# Patient Record
Sex: Female | Born: 1960 | Race: White | Hispanic: No | Marital: Married | Smoking: Never smoker
Health system: Southern US, Community
[De-identification: ages and names within clinical notes are randomized; demographics above are authoritative.]

## PROBLEM LIST (undated history)

## (undated) DIAGNOSIS — Q231 Congenital insufficiency of aortic valve: Secondary | ICD-10-CM

## (undated) DIAGNOSIS — I442 Atrioventricular block, complete: Secondary | ICD-10-CM

## (undated) DIAGNOSIS — I350 Nonrheumatic aortic (valve) stenosis: Secondary | ICD-10-CM

## (undated) DIAGNOSIS — Z7901 Long term (current) use of anticoagulants: Secondary | ICD-10-CM

## (undated) DIAGNOSIS — Q2381 Bicuspid aortic valve: Secondary | ICD-10-CM

## (undated) HISTORY — DX: Atrioventricular block, complete: I44.2

## (undated) HISTORY — DX: Nonrheumatic aortic (valve) stenosis: I35.0

## (undated) HISTORY — DX: Congenital insufficiency of aortic valve: Q23.1

## (undated) HISTORY — PX: OTHER SURGICAL HISTORY: SHX169

## (undated) HISTORY — DX: Long term (current) use of anticoagulants: Z79.01

## (undated) HISTORY — PX: BREAST SURGERY: SHX581

## (undated) HISTORY — DX: Bicuspid aortic valve: Q23.81

---

## 1998-09-23 ENCOUNTER — Other Ambulatory Visit: Admission: RE | Admit: 1998-09-23 | Discharge: 1998-09-23 | Payer: Self-pay | Admitting: Obstetrics & Gynecology

## 1999-11-03 ENCOUNTER — Other Ambulatory Visit: Admission: RE | Admit: 1999-11-03 | Discharge: 1999-11-03 | Payer: Self-pay | Admitting: Obstetrics & Gynecology

## 2000-11-06 ENCOUNTER — Other Ambulatory Visit: Admission: RE | Admit: 2000-11-06 | Discharge: 2000-11-06 | Payer: Self-pay | Admitting: Obstetrics & Gynecology

## 2001-12-20 ENCOUNTER — Ambulatory Visit (HOSPITAL_COMMUNITY): Admission: RE | Admit: 2001-12-20 | Discharge: 2001-12-20 | Payer: Self-pay | Admitting: Obstetrics & Gynecology

## 2002-02-11 ENCOUNTER — Other Ambulatory Visit: Admission: RE | Admit: 2002-02-11 | Discharge: 2002-02-11 | Payer: Self-pay | Admitting: Obstetrics & Gynecology

## 2003-08-20 ENCOUNTER — Other Ambulatory Visit: Admission: RE | Admit: 2003-08-20 | Discharge: 2003-08-20 | Payer: Self-pay | Admitting: Obstetrics & Gynecology

## 2004-10-04 ENCOUNTER — Other Ambulatory Visit: Admission: RE | Admit: 2004-10-04 | Discharge: 2004-10-04 | Payer: Self-pay | Admitting: Obstetrics & Gynecology

## 2005-11-22 ENCOUNTER — Other Ambulatory Visit: Admission: RE | Admit: 2005-11-22 | Discharge: 2005-11-22 | Payer: Self-pay | Admitting: Obstetrics & Gynecology

## 2011-08-15 ENCOUNTER — Other Ambulatory Visit: Payer: Self-pay | Admitting: Cardiology

## 2011-08-15 ENCOUNTER — Ambulatory Visit
Admission: RE | Admit: 2011-08-15 | Discharge: 2011-08-15 | Disposition: A | Discharge status: Home / Self Care | Payer: PRIVATE HEALTH INSURANCE | Source: Ambulatory Visit | Attending: Cardiology | Admitting: Cardiology

## 2011-08-15 DIAGNOSIS — I359 Nonrheumatic aortic valve disorder, unspecified: Secondary | ICD-10-CM

## 2011-08-17 ENCOUNTER — Ambulatory Visit (HOSPITAL_COMMUNITY)
Admission: RE | Admit: 2011-08-17 | Discharge: 2011-08-17 | Disposition: A | Discharge status: Home / Self Care | Payer: PRIVATE HEALTH INSURANCE | Source: Ambulatory Visit | Attending: Cardiology | Admitting: Cardiology

## 2011-08-17 ENCOUNTER — Encounter (HOSPITAL_COMMUNITY): Payer: Self-pay | Admitting: Cardiology

## 2011-08-17 DIAGNOSIS — I350 Nonrheumatic aortic (valve) stenosis: Secondary | ICD-10-CM | POA: Insufficient documentation

## 2011-08-17 DIAGNOSIS — I359 Nonrheumatic aortic valve disorder, unspecified: Secondary | ICD-10-CM | POA: Insufficient documentation

## 2011-08-17 HISTORY — PX: CARDIAC CATHETERIZATION: SHX172

## 2011-08-17 LAB — POCT I-STAT 3, VENOUS BLOOD GAS (G3P V)
Bicarbonate: 20.8 mEq/L (ref 20.0–24.0)
TCO2: 22 mmol/L (ref 0–100)
pCO2, Ven: 37.1 mmHg — ABNORMAL LOW (ref 45.0–50.0)
pH, Ven: 7.358 — ABNORMAL HIGH (ref 7.250–7.300)

## 2011-08-17 LAB — POCT I-STAT 3, ART BLOOD GAS (G3+)
O2 Saturation: 97 %
TCO2: 22 mmol/L (ref 0–100)
pCO2 arterial: 33.4 mmHg — ABNORMAL LOW (ref 35.0–45.0)
pH, Arterial: 7.408 — ABNORMAL HIGH (ref 7.350–7.400)
pO2, Arterial: 88 mmHg (ref 80.0–100.0)

## 2011-08-17 NOTE — Cardiovascular Report (Signed)
  NAMESUZZANE, Zoe Matthews               ACCOUNT NO.:  [id number]  MEDICAL RECORD NO.:  [id number]  LOCATION:  [facility]                         FACILITY:  [facility]  PHYSICIAN:  Georga Hacking, M.D.DATE OF BIRTH:  1961-02-28  DATE OF PROCEDURE: 08/17/2011                            CARDIAC CATHETERIZATION   INDICATIONS:  Symptomatic aortic stenosis.  PROCEDURE:  Right and left heart catheterizations with coronary angiograms, left ventriculogram, measurement of gradient across the aortic valve and a distal aortogram.  COMMENTS ABOUT PROCEDURE:  The patient was brought to the catheterization laboratory and was prepped and draped in the usual manner.  After Xylocaine anesthesia, a 7-French sheath was placed in the right femoral vein.  A right heart catheterization was done, cardiac outputs were measured.  The right femoral artery was then entered with a single anterior needle wall stick and a 5-French sheath was placed.  The aortic valve was crossed using a straight wire after the patient was given 1000 units of heparin.  It crossed on the first try with a peak-to- peak gradient of close to 100.  A 30 mL ventriculogram was performed. Coronary angiograms were made using 5-French catheters.  A distal aortic injection was then made using 30 mL of contrast and she was taken to the holding area for sheath removal and tolerated the procedure well.  HEMODYNAMIC DATA:  Right atrium:  A equals 7, V equals 4, mean equals 3. Right ventricle 30/124.  Pulmonary artery 30/11, mean equals 20, wedge mean of 12.  Aortic pressure 113/69.  LV pressure 205/113.  Peak-to-peak gradient is 92 mmHg.  Mean gradient 57 mmHg.  Aortic valve area 0.51 cm2.  Aortic saturation 97%.  Pulmonary artery saturation 69%.  ANGIOGRAPHIC DATA:  Left ventriculogram:  Performed in the 30-degree RAO projection.  There are increased trabeculations consistent with ventricular hypertrophy.  The left ventricle is normal in size.   The aortic valve is heavily calcified with reduced opening.  There was no mitral regurgitation.  The left ventricular wall motion is normal and estimated ejection fraction is 60%.  Coronary arteries arise and distribute normally.  There is no coronary calcification noted.  Left main coronary artery is normal.  Left anterior descending appears to have a somewhat twin LAD or a large diagonal branch and is free of disease.  The circumflex is normal.  The right coronary artery is a large dominant vessel and appears normal.  The distal aortogram shows normal bilateral renal arteries and a normal distal aorta and normal run off.  IMPRESSION: 1. Critical aortic stenosis. 2. Very minimal elevation of pulmonary artery systolic pressure. 3. Normal coronary arteries. 4. Normal distal aorta.  RECOMMENDATIONS:  Aortic valve replacement.  Consultation with Dr. Tressie Stalker.     Georga Hacking, M.D.     WST/MEDQ  D:  08/17/2011  T:  08/17/2011  Job:  [id number]  cc:   Freddy Finner, M.D. Salvatore Decent. Cornelius Moras, M.D.  Electronically Signed by Lacretia Nicks. Donnie Aho M.D. on 08/17/2011 12:03:23 PM

## 2011-08-18 ENCOUNTER — Institutional Professional Consult (permissible substitution) (INDEPENDENT_AMBULATORY_CARE_PROVIDER_SITE_OTHER): Payer: PRIVATE HEALTH INSURANCE | Admitting: Thoracic Surgery (Cardiothoracic Vascular Surgery)

## 2011-08-18 ENCOUNTER — Encounter: Payer: Self-pay | Admitting: Thoracic Surgery (Cardiothoracic Vascular Surgery)

## 2011-08-18 VITALS — BP 126/82 | HR 90 | Resp 20 | Ht 61.0 in | Wt 146.0 lb

## 2011-08-18 DIAGNOSIS — I350 Nonrheumatic aortic (valve) stenosis: Secondary | ICD-10-CM

## 2011-08-18 DIAGNOSIS — I359 Nonrheumatic aortic valve disorder, unspecified: Secondary | ICD-10-CM

## 2011-08-18 NOTE — Progress Notes (Signed)
PCP is Freddy Finner, MD Referring Provider is Darden Palmer., *  Chief Complaint  Patient presents with  . Aortic Stenosis    Referral from [name] for surgical eval, Aortic stensos, Cath'ed 08/17/11    HPI: Patient is a 50 year old married white female from [place] with known history of bicuspid aortic valve and aortic stenosis. The patient was noted to have a heart murmur at birth and has been followed carefully ever since. For the past several years the patient has had routine followup echocardiograms with Dr. Donnie Aho. Over the last 3 months the patient has developed exertional shortness of breath. Followup echocardiogram performed 08/09/2011 revealed progression of severity of aortic stenosis the point where the patient now has quite severe aortic stenosis. The peak velocity across the aortic valve measured 5.5 m/s. Peak and mean transvalvular gradients were estimated to be 120 and 78 mm mercury respectively. The patient has concentric left ventricular hypertrophy with normal left ventricular systolic function. There was trace mitral regurgitation and mild tricuspid regurgitation. The patient subsequently underwent left and right heart catheterization on 08/17/2011. This confirmed the presence of severe aortic stenosis with a mean gradient across the valve measured 57 mm mercury and peak to peak gradient 92 mm mercury. Aortic valve area was estimated 0.51 cm. Pulmonary artery pressures measured 30/11 with a pulmonary A wedge pressure of 12. The patient was noted to have normal coronary artery anatomy with no significant coronary artery disease. 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 The descending thoracic and abdominal aorta and iliac vessels was normal and free of any significant aortoiliac disease. The patient has been referred for elective aortic valve replacement.  Past Medical History  Diagnosis Date  . AS (aortic stenosis)   . Bicuspid aortic valve      Past Surgical History  Procedure Date  . Cardiac catheterization 08/17/11    normal coronaries, critical AS  . Cyst left breast removed     Family History  Problem Relation Age of Onset  . Diabetes Father   . Hyperlipidemia Father   . Hypertension Father     Social History History  Substance Use Topics  . Smoking status: Never Smoker   . Smokeless tobacco: Never Used  . Alcohol Use: No    Current Outpatient Prescriptions  Medication Sig Dispense Refill  . B Complex Vitamins (VITAMIN-B COMPLEX) TABS Take 1 tablet by mouth 1 day or 1 dose.        . calcium carbonate (OS-CAL) 600 MG TABS Take 600 mg by mouth 2 (two) times daily with a meal.        . Cholecalciferol (VITAMIN D3) 2000 UNITS capsule Take 2,000 Units by mouth daily.        . Magnesium 250 MG TABS Take 1 tablet by mouth 1 day or 1 dose.        . Multiple Vitamin (MULTIVITAMIN) tablet Take 1 tablet by mouth daily.          Allergies  Allergen Reactions  . Codeine Nausea And Vomiting  . Sulfa Antibiotics Hives    Review of Systems: The patient reports normal appetite. She is 5 foot 1 inches tall and weighs approximately 146 pounds. She has been actively trying to lose weight with a combination of diet and exercise, and she is been successful in having lost 25 pounds over last 6 months to a year. The patient reports a three-month history of exertional shortness of breath. Shortness of breath occurs  with moderate physical activity. The patient denies resting shortness of breath, PND, orthopnea, or lower extremity edema. The patient has occasional brief palpitations. The patient denies any chest tightness chest pressure either with activity or at rest. Patient denies any productive cough, hemoptysis, wheezing. Patient reports no difficulty swallowing. She denies symptoms of reflux. Bowel function is regular. She has no history of any hematochezia, hematemesis, melena. Genitourinary urinary review of systems is normal  with no history of urinary urgency or frequency. Patient denies arthritis or arthralgias. The patient denies pain in her legs with walking. She denies any dizzy spells or syncopal episodes. She denies any transient visual disturbances. Patient denies any transient numbness or weakness involving either upper or lower extremity. She has good dentition and sees his dentist on a regular basis. She has no recent visual disturbances. The remainder of her review of systems unremarkable.  BP 126/82  Pulse 90  Resp 20  Ht 5\' 1"  (1.549 m)  Wt 146 lb (66.225 kg)  BMI 27.59 kg/m2  SpO2 99%  LMP 08/15/2011 Physical Exam: On physical exam the patient is a well-appearing female who appears his stated age in no acute distress. HEENT exam is unrevealing. The neck is supple. There is no palpable lymphadenopathy. There no carotid bruits. Auscultation of the chest demonstrates clear breath sounds which are symmetrical bilaterally. Cardiovascular exam is notable for regular rate and rhythm. There is a prominent grade 3/6 crescendo decrescendo systolic murmur heard best along the sternal border. No diastolic murmurs are noted. The abdomen is soft slightly obese and nontender. There are no palpable masses. Bowel sounds are present. Extremities are warm and well-perfused. Patient is tender in the right groin from cardiac catheterization. Femoral pulse on the left is palpable but somewhat diminished. There is no lower extremity edema. The skin is clean dry and healthy appearing throughout. Rectal and GU exams are both deferred. Neurologic examination is grossly nonfocal and symmetrical throughout.  Diagnostic Tests: Report of 2-D echocardiogram performed 08/09/2011 is reviewed. This confirmed the presence of bicuspid aortic valve with severe aortic stenosis. Peak velocity across the valve was 5.5 m/s. Peak and mean transvalvular gradients were estimated 120 and 78 mm mercury respectively. Aortic valve area was estimated 0.4  cm. Left ventricular outflow tract diameter was reportedly 1.9 cm. Left ventricular systolic function with ejection fraction estimated at 55%. There is mild to moderate concentric left ventricular hypertrophy. There was mild to moderate left atrial chamber enlargement. There is trace mitral regurgitation. There is mild tricuspid regurgitation. No other abnormalities were noted. In comparison with reports from previous echocardiograms in April 2012 degree of aortic stenosis have increased further.  Left and right heart catheterization performed 08/17/2011 is reviewed. This confirmed the presence of severe aortic stenosis with mean gradient across the valve measured 57 mm mercury. Aortic valve area was estimated 0.51 cm. Pulmonary artery pressures measured 30/11 with pulmonary A wedge pressure 12 and central venous pressure was 3. There is normal coronary artery anatomy with right dominant coronary circulation. There is no significant coronary artery disease. There is mild dilatation of the a setting aorta but no significant aneurysm formation. The descending thoracic aorta and abdominal aorta are normal. Iliac vessels are normal. There is no significant aortoiliac disease.  Impression: Bicuspid native aortic valve with severe aortic stenosis. The patient has developed symptoms of exertional shortness of breath. She is normal left ventricular function and normal coronary artery anatomy with no significant coronary artery disease. I agree that the patient needs elective  aortic valve replacement. By report the patient's left ventricular output tract diameter was only 1.9 cm. The patient is relatively small in stature with total body surface area approximately 1.7-1.8 meters squared.  Plan: I've discussed options at length with the patient and her sister here in the office today. Her husband is out of town for business and will return within 2 weeks. We discussed the relative risks and benefits of elective  aortic valve replacement versus continued followup medical therapy. The rationale for proceeding with surgery at this juncture has been discussed in detail. Surgical alternatives have been discussed in detail. In particular, we spent a great deal of time discussing whether or not to replace her valve using a mechanical prosthesis, a bioprosthetic tissue valve, or the pulmonary autograft (Ross procedure). We discussed the risks of long-term anticoagulation with Coumadin in the setting of mechanical valve, and we discussed the risk of late structural valve deterioration in failure following either bioprosthetic tissue valve or Ross procedure. We also discussed the fact that she may have a relatively small sized aortic root and that this may impact are surgical approach. The possibility of need for aortic root enlargement or aortic root replacement has been discussed. At this point she seems to be inclined to wish for her valve be replaced using a mechanical prosthesis. This seems entirely reasonable under the circumstances and is probably the best choice for her.  The patient has expressed interest in minimally invasive approach for aortic valve replacement. Overall I think she would be an excellent candidate for use of mini thoracotomy approach with the only potential concern being the fact that if her aortic root is in fact quite small the mini thoracotomy approach might affect adversely our ability to get the big as possible mechanical prosthesis safely implanted. I will make an effort to directly reviewed images from her recent echocardiogram. We will also plan to perform transesophageal echocardiogram intraoperatively before we begin her operation. If either in fact suggest that her true aortic annulus is less than 21 mm in diameter, I would favor a more conservative approach using a conventional sternotomy. However, if her aortic root in fact appears to be adequate in size, it would be reasonable to proceed with  the mini thoracotomy approach. The patient understands and accepts all potential associated risks of surgery. We tentatively plan to proceed with surgery on November 15 after her husband has returned to town. We will see her back here in the office on November 12 for followup prior to surgery. All of her questions been addressed.

## 2011-08-21 ENCOUNTER — Other Ambulatory Visit: Payer: Self-pay | Admitting: Thoracic Surgery (Cardiothoracic Vascular Surgery)

## 2011-08-21 DIAGNOSIS — I059 Rheumatic mitral valve disease, unspecified: Secondary | ICD-10-CM

## 2011-08-21 DIAGNOSIS — I359 Nonrheumatic aortic valve disorder, unspecified: Secondary | ICD-10-CM

## 2011-08-25 ENCOUNTER — Other Ambulatory Visit: Payer: Self-pay

## 2011-08-25 DIAGNOSIS — I359 Nonrheumatic aortic valve disorder, unspecified: Secondary | ICD-10-CM

## 2011-08-28 ENCOUNTER — Other Ambulatory Visit: Payer: Self-pay

## 2011-08-28 ENCOUNTER — Encounter (HOSPITAL_COMMUNITY)
Admission: RE | Admit: 2011-08-28 | Discharge: 2011-08-28 | Disposition: A | Discharge status: Home / Self Care | Payer: PRIVATE HEALTH INSURANCE | Source: Ambulatory Visit | Attending: Thoracic Surgery (Cardiothoracic Vascular Surgery) | Admitting: Thoracic Surgery (Cardiothoracic Vascular Surgery)

## 2011-08-28 ENCOUNTER — Ambulatory Visit (INDEPENDENT_AMBULATORY_CARE_PROVIDER_SITE_OTHER): Payer: No Typology Code available for payment source | Admitting: Thoracic Surgery (Cardiothoracic Vascular Surgery)

## 2011-08-28 ENCOUNTER — Encounter (HOSPITAL_COMMUNITY): Payer: Self-pay

## 2011-08-28 ENCOUNTER — Encounter: Payer: Self-pay | Admitting: Thoracic Surgery (Cardiothoracic Vascular Surgery)

## 2011-08-28 ENCOUNTER — Ambulatory Visit (HOSPITAL_COMMUNITY)
Admission: RE | Admit: 2011-08-28 | Discharge: 2011-08-28 | Disposition: A | Discharge status: Home / Self Care | Payer: PRIVATE HEALTH INSURANCE | Source: Ambulatory Visit | Attending: Thoracic Surgery (Cardiothoracic Vascular Surgery) | Admitting: Thoracic Surgery (Cardiothoracic Vascular Surgery)

## 2011-08-28 VITALS — BP 126/77 | HR 80 | Resp 18 | Ht 61.0 in | Wt 145.0 lb

## 2011-08-28 DIAGNOSIS — I359 Nonrheumatic aortic valve disorder, unspecified: Secondary | ICD-10-CM

## 2011-08-28 DIAGNOSIS — R0602 Shortness of breath: Secondary | ICD-10-CM | POA: Insufficient documentation

## 2011-08-28 DIAGNOSIS — I2510 Atherosclerotic heart disease of native coronary artery without angina pectoris: Secondary | ICD-10-CM

## 2011-08-28 DIAGNOSIS — Z01810 Encounter for preprocedural cardiovascular examination: Secondary | ICD-10-CM

## 2011-08-28 DIAGNOSIS — I350 Nonrheumatic aortic (valve) stenosis: Secondary | ICD-10-CM

## 2011-08-28 LAB — CBC
Hemoglobin: 11.9 g/dL — ABNORMAL LOW (ref 12.0–15.0)
RBC: 3.7 MIL/uL — ABNORMAL LOW (ref 3.87–5.11)
WBC: 5.9 10*3/uL (ref 4.0–10.5)

## 2011-08-28 LAB — COMPREHENSIVE METABOLIC PANEL
ALT: 11 U/L (ref 0–35)
Alkaline Phosphatase: 59 U/L (ref 39–117)
CO2: 21 mEq/L (ref 19–32)
Chloride: 104 mEq/L (ref 96–112)
GFR calc Af Amer: >90 mL/min (ref >90)
GFR calc non Af Amer: >90 mL/min (ref >90)
Glucose, Bld: 87 mg/dL (ref 70–99)
Potassium: 3.8 mEq/L (ref 3.5–5.1)
Sodium: 137 mEq/L (ref 135–145)
Total Protein: 6.8 g/dL (ref 6.0–8.3)

## 2011-08-28 LAB — ABO/RH: ABO/RH(D): O POS

## 2011-08-28 LAB — BLOOD GAS, ARTERIAL
Drawn by: 344381
FIO2: 0.21 %
pCO2 arterial: 36.9 mmHg (ref 35.0–45.0)
pO2, Arterial: 139 mmHg — ABNORMAL HIGH (ref 80.0–100.0)

## 2011-08-28 LAB — URINALYSIS, ROUTINE W REFLEX MICROSCOPIC
Ketones, ur: NEGATIVE mg/dL
Leukocytes, UA: NEGATIVE
Nitrite: NEGATIVE
pH: 6 (ref 5.0–8.0)

## 2011-08-28 LAB — PROTIME-INR: INR: 1 (ref 0.00–1.49)

## 2011-08-28 MED ORDER — CHLORHEXIDINE GLUCONATE 4 % EX LIQD: 30.0000 mL | CUTANEOUS | Status: DC

## 2011-08-28 NOTE — H&P (Signed)
Purcell Nails, MD  08/18/2011  2:25 PM  Signed  PCP is Freddy Finner, MD Referring Provider is Darden Palmer., *    Chief Complaint   Patient presents with   .  Aortic Stenosis       Referral from [name] for surgical eval, Aortic stensos, Cath'ed 08/17/11     HPI: Patient is a 50 year old married white female from [place] with known history of bicuspid aortic valve and aortic stenosis. The patient was noted to have a heart murmur at birth and has been followed carefully ever since. For the past several years the patient has had routine followup echocardiograms with Dr. Donnie Aho. Over the last 3 months the patient has developed exertional shortness of breath. Followup echocardiogram performed 08/09/2011 revealed progression of severity of aortic stenosis the point where the patient now has quite severe aortic stenosis. The peak velocity across the aortic valve measured 5.5 m/s. Peak and mean transvalvular gradients were estimated to be 120 and 78 mm mercury respectively. The patient has concentric left ventricular hypertrophy with normal left ventricular systolic function. There was trace mitral regurgitation and mild tricuspid regurgitation. The patient subsequently underwent left and right heart catheterization on 08/17/2011. This confirmed the presence of severe aortic stenosis with a mean gradient across the valve measured 57 mm mercury and peak to peak gradient 92 mm mercury. Aortic valve area was estimated 0.51 cm. Pulmonary artery pressures measured 30/11 with a pulmonary A wedge pressure of 12. The patient was noted to have normal coronary artery anatomy with no significant coronary artery disease. 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 The descending thoracic and abdominal aorta and iliac vessels was normal and free of any significant aortoiliac disease. The patient has been referred for elective aortic valve replacement.    Past Medical History   Diagnosis   Date   .  AS (aortic stenosis)     .  Bicuspid aortic valve         Past Surgical History   Procedure  Date   .  Cardiac catheterization  08/17/11       normal coronaries, critical AS   .  Cyst left breast removed         Family History   Problem  Relation  Age of Onset   .  Diabetes  Father     .  Hyperlipidemia  Father     .  Hypertension  Father       Social History History   Substance Use Topics   .  Smoking status:  Never Smoker    .  Smokeless tobacco:  Never Used   .  Alcohol Use:  No       Current Outpatient Prescriptions   Medication  Sig  Dispense  Refill   .  B Complex Vitamins (VITAMIN-B COMPLEX) TABS  Take 1 tablet by mouth 1 day or 1 dose.           .  calcium carbonate (OS-CAL) 600 MG TABS  Take 600 mg by mouth 2 (two) times daily with a meal.           .  Cholecalciferol (VITAMIN D3) 2000 UNITS capsule  Take 2,000 Units by mouth daily.           .  Magnesium 250 MG TABS  Take 1 tablet by mouth 1 day or 1 dose.           .  Multiple Vitamin (  MULTIVITAMIN) tablet  Take 1 tablet by mouth daily.               Allergies   Allergen  Reactions   .  Codeine  Nausea And Vomiting   .  Sulfa Antibiotics  Hives     Review of Systems: The patient reports normal appetite. She is 5 foot 1 inches tall and weighs approximately 146 pounds. She has been actively trying to lose weight with a combination of diet and exercise, and she is been successful in having lost 25 pounds over last 6 months to a year. The patient reports a three-month history of exertional shortness of breath. Shortness of breath occurs with moderate physical activity. The patient denies resting shortness of breath, PND, orthopnea, or lower extremity edema. The patient has occasional brief palpitations. The patient denies any chest tightness chest pressure either with activity or at rest. Patient denies any productive cough, hemoptysis, wheezing. Patient reports no difficulty swallowing. She denies symptoms  of reflux. Bowel function is regular. She has no history of any hematochezia, hematemesis, melena. Genitourinary urinary review of systems is normal with no history of urinary urgency or frequency. Patient denies arthritis or arthralgias. The patient denies pain in her legs with walking. She denies any dizzy spells or syncopal episodes. She denies any transient visual disturbances. Patient denies any transient numbness or weakness involving either upper or lower extremity. She has good dentition and sees his dentist on a regular basis. She has no recent visual disturbances. The remainder of her review of systems unremarkable.  BP 126/82  Pulse 90  Resp 20  Ht 5\' 1"  (1.549 m)  Wt 146 lb (66.225 kg)  BMI 27.59 kg/m2  SpO2 99%  LMP 08/15/2011 Physical Exam: On physical exam the patient is a well-appearing female who appears his stated age in no acute distress. HEENT exam is unrevealing. The neck is supple. There is no palpable lymphadenopathy. There no carotid bruits. Auscultation of the chest demonstrates clear breath sounds which are symmetrical bilaterally. Cardiovascular exam is notable for regular rate and rhythm. There is a prominent grade 3/6 crescendo decrescendo systolic murmur heard best along the sternal border. No diastolic murmurs are noted. The abdomen is soft slightly obese and nontender. There are no palpable masses. Bowel sounds are present. Extremities are warm and well-perfused. Patient is tender in the right groin from cardiac catheterization. Femoral pulse on the left is palpable but somewhat diminished. There is no lower extremity edema. The skin is clean dry and healthy appearing throughout. Rectal and GU exams are both deferred. Neurologic examination is grossly nonfocal and symmetrical throughout.  Diagnostic Tests: Report of 2-D echocardiogram performed 08/09/2011 is reviewed. This confirmed the presence of bicuspid aortic valve with severe aortic stenosis. Peak velocity across  the valve was 5.5 m/s. Peak and mean transvalvular gradients were estimated 120 and 78 mm mercury respectively. Aortic valve area was estimated 0.4 cm. Left ventricular outflow tract diameter was reportedly 1.9 cm. Left ventricular systolic function with ejection fraction estimated at 55%. There is mild to moderate concentric left ventricular hypertrophy. There was mild to moderate left atrial chamber enlargement. There is trace mitral regurgitation. There is mild tricuspid regurgitation. No other abnormalities were noted. In comparison with reports from previous echocardiograms in April 2012 degree of aortic stenosis have increased further.  Left and right heart catheterization performed 08/17/2011 is reviewed. This confirmed the presence of severe aortic stenosis with mean gradient across the valve measured 57 mm mercury. Aortic valve  area was estimated 0.51 cm. Pulmonary artery pressures measured 30/11 with pulmonary A wedge pressure 12 and central venous pressure was 3. There is normal coronary artery anatomy with right dominant coronary circulation. There is no significant coronary artery disease. There is mild dilatation of the a setting aorta but no significant aneurysm formation. The descending thoracic aorta and abdominal aorta are normal. Iliac vessels are normal. There is no significant aortoiliac disease.  Impression: Bicuspid native aortic valve with severe aortic stenosis. The patient has developed symptoms of exertional shortness of breath. She is normal left ventricular function and normal coronary artery anatomy with no significant coronary artery disease. I agree that the patient needs elective aortic valve replacement.   Plan:  I have reviewed the indications, risks, and potential benefits of surgery with the patient and her husband here in the office today.  We spent in excess of 30 minutes during her consultation today. Alternative treatment strategies were discussed. They understand  and accept all potential risks of surgery including but not limited to risk of death, stroke or other neurologic complication, myocardial infarction, congestive heart failure, respiratory failure, renal failure, bleeding requiring transfusion and/or reexploration, arrhythmia, infection or other wound complications, pneumonia, pleural and/or pericardial effusion, pulmonary embolus, aortic dissection or other major vascular complication, or delayed complications related to replacement.    The patient hopes to have her surgery performed via minimally invasive approach. We plan to proceed with surgery on Thursday and initially carefully evaluate the size of her aortic annulus and aortic root using transesophageal echocardiogram. If it appears that the aortic root size is in fact relatively small we will proceed with a conventional sternotomy. If the aortic root size appears to be adequate, we will proceed with the mini thoracotomy approach.  The patient specifically requests that her valve be replaced using a mechanical prosthesis. She understands and accepts all associated risks related to long-term anticoagulation.  All of their questions have been answered.

## 2011-08-28 NOTE — Pre-Procedure Instructions (Addendum)
[address] [facility]  08/28/2011   Your procedure is scheduled on:  08/31/2011  Report to [facility] at 5:30 AM.  Call this number if you have problems the morning of surgery: [phone]   Remember:   Do not eat food:After Midnight.  Do not drink clear liquids: 4 Hours before arrival.  Take these medicines the morning of surgery with A SIP OF WATER: nothing    Do not wear jewelry, make-up or nail polish.  Do not wear lotions, powders, or perfumes. You may wear deodorant.  Do not shave 48 hours prior to surgery.  Do not bring valuables to the hospital.  Contacts, dentures or bridgework may not be worn into surgery.  Leave suitcase in the car. After surgery it may be brought to your room.  For patients admitted to the hospital, checkout time is 11:00 AM the day of discharge.   Patients discharged the day of surgery will not be allowed to drive home.  Name and phone number of your driver:   Special Instructions: Incentive Spirometry - Practice and bring it with you on the day of surgery.   Please read over the following fact sheets that you were given: Pain Booklet, Coughing and Deep Breathing, Blood Transfusion Information, Open Heart Packet, MRSA Information and Surgical Site Infection Prevention

## 2011-08-28 NOTE — Progress Notes (Signed)
*  PRELIMINARY RESULTS*  Pre-CABG has been performed. Preliminary - No significant ICA stenosis bilaterally. Vertebral arteries are patent with antegrade flow.  [facility] [facility] Evaluation - Right Doppler waveforms remain normal with radial and ulnar compressions.  Left Doppler waveforms decreased greater than 50% with radial compression and remain normal with ulnar compression.   Zoe Matthews 08/28/2011, 12:08 PM

## 2011-08-28 NOTE — Progress Notes (Signed)
Patient returns to the office today for followup with plans to proceed with elective aortic valve replacement later this week. Since her office consultation last week, I've had the opportunity to directly reviewed images from the echocardiogram she had performed in Dr. York Spaniel office. This echocardiogram clearly confirmed the presence of severe aortic stenosis. The left ventricular outflow tract diameter measured 1.9 cm. The aortic root was considerably larger one measured at the sinotubular junction. It is difficult to tell for certain the diameter of the true aortic annulus. There were no other complicating features identified.  I again reviewed the indications, risks, and potential benefits of surgery with the patient and her husband here in the office today.  We spent in excess of 30 minutes during her consultation today. Alternative treatment strategies were discussed. They understand and accept all potential risks of surgery including but not limited to risk of death, stroke or other neurologic complication, myocardial infarction, congestive heart failure, respiratory failure, renal failure, bleeding requiring transfusion and/or reexploration, arrhythmia, infection or other wound complications, pneumonia, pleural and/or pericardial effusion, pulmonary embolus, aortic dissection or other major vascular complication, or delayed complications related to replacement.    The patient hopes to have her surgery performed via minimally invasive approach. We plan to proceed with surgery on Thursday and initially carefully evaluate the size of her aortic annulus and aortic root using transesophageal echocardiogram. If it appears that the aortic root size is in fact relatively small we will proceed with a conventional sternotomy. If the aortic root size appears to be adequate, we will proceed with the mini thoracotomy approach.  The patient specifically requests that her valve be replaced using a mechanical  prosthesis. She understands and accepts all associated risks related to long-term anticoagulation.  All of their questions have been answered.

## 2011-08-28 NOTE — Progress Notes (Signed)
Call to Dr. York Spaniel office, left message for M.R. To send last ov note, echo, etc.

## 2011-08-29 ENCOUNTER — Encounter (HOSPITAL_COMMUNITY): Payer: Self-pay | Admitting: Pharmacy Technician

## 2011-08-30 MED ORDER — CEFUROXIME SODIUM 1.5 G IJ SOLR
1.5000 g | INTRAMUSCULAR | Status: AC
  Administered 2011-08-31: 750 mg via INTRAVENOUS
  Administered 2011-08-31: 1500 mg via INTRAVENOUS
  Filled 2011-08-30: qty 1.5

## 2011-08-30 MED ORDER — SODIUM CHLORIDE 0.9 % IV SOLN
0.1000 ug/kg/h | INTRAVENOUS | Status: DC
  Filled 2011-08-30: qty 4

## 2011-08-30 MED ORDER — DOPAMINE-DEXTROSE 3.2-5 MG/ML-% IV SOLN
2.0000 ug/kg/min | INTRAVENOUS | Status: DC
  Filled 2011-08-30: qty 250

## 2011-08-30 MED ORDER — NITROGLYCERIN IN D5W 200-5 MCG/ML-% IV SOLN
2.0000 ug/min | INTRAVENOUS | Status: DC
  Filled 2011-08-30: qty 250

## 2011-08-30 MED ORDER — PLASMA-LYTE 148 IV SOLN
INTRAVENOUS | Status: AC
  Administered 2011-08-31: 11:00:00
  Filled 2011-08-30: qty 0.5

## 2011-08-30 MED ORDER — MAGNESIUM SULFATE 50 % IJ SOLN
40.0000 meq | INTRAMUSCULAR | Status: DC
  Filled 2011-08-30: qty 10

## 2011-08-30 MED ORDER — EPINEPHRINE HCL 1 MG/ML IJ SOLN
0.5000 ug/min | INTRAVENOUS | Status: DC
  Filled 2011-08-30: qty 4

## 2011-08-30 MED ORDER — INSULIN REGULAR HUMAN 100 UNIT/ML IJ SOLN
INTRAMUSCULAR | Status: DC
  Filled 2011-08-30: qty 1

## 2011-08-30 MED ORDER — METOPROLOL TARTRATE 12.5 MG HALF TABLET
12.5000 mg | ORAL_TABLET | Freq: Once | ORAL | Status: AC
  Administered 2011-08-31: 12.5 mg via ORAL

## 2011-08-30 MED ORDER — VANCOMYCIN HCL 1000 MG IV SOLR
1250.0000 mg | INTRAVENOUS | Status: DC
  Filled 2011-08-30: qty 1250

## 2011-08-30 MED ORDER — PHENYLEPHRINE HCL 10 MG/ML IJ SOLN
30.0000 ug/min | INTRAVENOUS | Status: DC
  Filled 2011-08-30: qty 2

## 2011-08-30 MED ORDER — POTASSIUM CHLORIDE 2 MEQ/ML IV SOLN
80.0000 meq | INTRAVENOUS | Status: DC
  Filled 2011-08-30: qty 40

## 2011-08-30 MED ORDER — SODIUM CHLORIDE 0.9 % IV SOLN
INTRAVENOUS | Status: DC
  Filled 2011-08-30: qty 40

## 2011-08-30 MED ORDER — CEFUROXIME SODIUM 750 MG IJ SOLR
750.0000 mg | INTRAMUSCULAR | Status: DC
  Filled 2011-08-30: qty 750

## 2011-08-30 NOTE — Consult Note (Signed)
Anesthesia:  50 year old female with AS for AVR on 08/31/11.  Asked to review pre-op EKG.  Patient just had recent Cardiology evaluation with cath showing normal coronaries but severe AS.  Labs, CXR noted.  Plan to proceed.

## 2011-08-31 ENCOUNTER — Encounter (HOSPITAL_COMMUNITY): Payer: Self-pay | Admitting: Vascular Surgery

## 2011-08-31 ENCOUNTER — Inpatient Hospital Stay (HOSPITAL_COMMUNITY): Payer: PRIVATE HEALTH INSURANCE

## 2011-08-31 ENCOUNTER — Ambulatory Visit (HOSPITAL_COMMUNITY): Payer: PRIVATE HEALTH INSURANCE | Admitting: Vascular Surgery

## 2011-08-31 ENCOUNTER — Encounter (HOSPITAL_COMMUNITY)
Admission: RE | Disposition: A | Discharge status: Home / Self Care | Payer: Self-pay | Source: Ambulatory Visit | Attending: Thoracic Surgery (Cardiothoracic Vascular Surgery)

## 2011-08-31 ENCOUNTER — Other Ambulatory Visit: Payer: Self-pay

## 2011-08-31 ENCOUNTER — Encounter: Payer: Self-pay | Admitting: Thoracic Surgery (Cardiothoracic Vascular Surgery)

## 2011-08-31 ENCOUNTER — Encounter (HOSPITAL_COMMUNITY): Payer: Self-pay

## 2011-08-31 ENCOUNTER — Other Ambulatory Visit: Payer: Self-pay | Admitting: Thoracic Surgery (Cardiothoracic Vascular Surgery)

## 2011-08-31 ENCOUNTER — Inpatient Hospital Stay (HOSPITAL_COMMUNITY)
Admission: RE | Admit: 2011-08-31 | Discharge: 2011-09-06 | DRG: 220 | Disposition: A | Discharge status: Home / Self Care | Payer: PRIVATE HEALTH INSURANCE | Source: Ambulatory Visit | Attending: Thoracic Surgery (Cardiothoracic Vascular Surgery) | Admitting: Thoracic Surgery (Cardiothoracic Vascular Surgery)

## 2011-08-31 DIAGNOSIS — I519 Heart disease, unspecified: Secondary | ICD-10-CM | POA: Diagnosis not present

## 2011-08-31 DIAGNOSIS — Z952 Presence of prosthetic heart valve: Secondary | ICD-10-CM

## 2011-08-31 DIAGNOSIS — I442 Atrioventricular block, complete: Secondary | ICD-10-CM | POA: Diagnosis not present

## 2011-08-31 DIAGNOSIS — Z950 Presence of cardiac pacemaker: Secondary | ICD-10-CM | POA: Diagnosis not present

## 2011-08-31 DIAGNOSIS — I359 Nonrheumatic aortic valve disorder, unspecified: Secondary | ICD-10-CM

## 2011-08-31 DIAGNOSIS — D62 Acute posthemorrhagic anemia: Secondary | ICD-10-CM | POA: Diagnosis not present

## 2011-08-31 DIAGNOSIS — Q231 Congenital insufficiency of aortic valve: Principal | ICD-10-CM

## 2011-08-31 DIAGNOSIS — I9789 Other postprocedural complications and disorders of the circulatory system, not elsewhere classified: Secondary | ICD-10-CM | POA: Diagnosis not present

## 2011-08-31 DIAGNOSIS — I350 Nonrheumatic aortic (valve) stenosis: Secondary | ICD-10-CM | POA: Diagnosis present

## 2011-08-31 DIAGNOSIS — E8779 Other fluid overload: Secondary | ICD-10-CM | POA: Diagnosis not present

## 2011-08-31 DIAGNOSIS — Y831 Surgical operation with implant of artificial internal device as the cause of abnormal reaction of the patient, or of later complication, without mention of misadventure at the time of the procedure: Secondary | ICD-10-CM | POA: Diagnosis not present

## 2011-08-31 DIAGNOSIS — IMO0002 Reserved for concepts with insufficient information to code with codable children: Secondary | ICD-10-CM | POA: Diagnosis not present

## 2011-08-31 HISTORY — PX: BENTALL PROCEDURE: SHX5058

## 2011-08-31 HISTORY — PX: AORTIC VALVE REPLACEMENT: SHX41

## 2011-08-31 LAB — POCT I-STAT 4, (NA,K, GLUC, HGB,HCT)
Glucose, Bld: 101 mg/dL — ABNORMAL HIGH (ref 70–99)
Glucose, Bld: 101 mg/dL — ABNORMAL HIGH (ref 70–99)
Glucose, Bld: 131 mg/dL — ABNORMAL HIGH (ref 70–99)
Glucose, Bld: 153 mg/dL — ABNORMAL HIGH (ref 70–99)
Glucose, Bld: 110 mg/dL — ABNORMAL HIGH (ref 70–99)
HCT: 29 % — ABNORMAL LOW (ref 36.0–46.0)
HCT: 21 % — ABNORMAL LOW (ref 36.0–46.0)
HCT: 25 % — ABNORMAL LOW (ref 36.0–46.0)
HCT: 26 % — ABNORMAL LOW (ref 36.0–46.0)
HCT: 29 % — ABNORMAL LOW (ref 36.0–46.0)
HCT: 33 % — ABNORMAL LOW (ref 36.0–46.0)
Hemoglobin: 9.9 g/dL — ABNORMAL LOW (ref 12.0–15.0)
Hemoglobin: 7.1 g/dL — ABNORMAL LOW (ref 12.0–15.0)
Hemoglobin: 9.9 g/dL — ABNORMAL LOW (ref 12.0–15.0)
Hemoglobin: 8.8 g/dL — ABNORMAL LOW (ref 12.0–15.0)
Hemoglobin: 9.9 g/dL — ABNORMAL LOW (ref 12.0–15.0)
Hemoglobin: 11.2 g/dL — ABNORMAL LOW (ref 12.0–15.0)
Potassium: 3.6 mEq/L (ref 3.5–5.1)
Potassium: 3.6 mEq/L (ref 3.5–5.1)
Potassium: 5.6 mEq/L — ABNORMAL HIGH (ref 3.5–5.1)
Potassium: 4 mEq/L (ref 3.5–5.1)
Sodium: 142 mEq/L (ref 135–145)
Sodium: 136 mEq/L (ref 135–145)
Sodium: 133 mEq/L — ABNORMAL LOW (ref 135–145)
Sodium: 143 mEq/L (ref 135–145)
Sodium: 143 mEq/L (ref 135–145)

## 2011-08-31 LAB — POCT I-STAT 3, ART BLOOD GAS (G3+)
Acid-base deficit: 1 mmol/L (ref 0.0–2.0)
Bicarbonate: 26 mEq/L — ABNORMAL HIGH (ref 20.0–24.0)
Bicarbonate: 24.5 mEq/L — ABNORMAL HIGH (ref 20.0–24.0)
O2 Saturation: 100 %
TCO2: 26 mmol/L (ref 0–100)
pCO2 arterial: 48.3 mmHg — ABNORMAL HIGH (ref 35.0–45.0)
pCO2 arterial: 40.4 mmHg (ref 35.0–45.0)
pH, Arterial: 7.334 — ABNORMAL LOW (ref 7.350–7.400)
pH, Arterial: 7.389 (ref 7.350–7.400)
pO2, Arterial: 106 mmHg — ABNORMAL HIGH (ref 80.0–100.0)

## 2011-08-31 LAB — PLATELET COUNT
Platelets: 136 10*3/uL — ABNORMAL LOW (ref 150–400)
Platelets: 57 10*3/uL — ABNORMAL LOW (ref 150–400)

## 2011-08-31 LAB — POCT I-STAT 7, (LYTES, BLD GAS, ICA,H+H)
Bicarbonate: 23.9 mEq/L (ref 20.0–24.0)
HCT: 28 % — ABNORMAL LOW (ref 36.0–46.0)
Hemoglobin: 9.5 g/dL — ABNORMAL LOW (ref 12.0–15.0)
Patient temperature: 36.2
pH, Arterial: 7.407 — ABNORMAL HIGH (ref 7.350–7.400)
pO2, Arterial: 57 mmHg — ABNORMAL LOW (ref 80.0–100.0)

## 2011-08-31 LAB — HEMOGLOBIN AND HEMATOCRIT, BLOOD
HCT: 23.8 % — ABNORMAL LOW (ref 36.0–46.0)
Hemoglobin: 8.2 g/dL — ABNORMAL LOW (ref 12.0–15.0)

## 2011-08-31 LAB — POCT I-STAT GLUCOSE
Glucose, Bld: 197 mg/dL — ABNORMAL HIGH (ref 70–99)
Operator id: 125961

## 2011-08-31 LAB — CREATININE, SERUM: GFR calc non Af Amer: >90 mL/min (ref >90)

## 2011-08-31 LAB — GLUCOSE, CAPILLARY
Glucose-Capillary: 127 mg/dL — ABNORMAL HIGH (ref 70–99)
Glucose-Capillary: 132 mg/dL — ABNORMAL HIGH (ref 70–99)

## 2011-08-31 LAB — CBC
HCT: 28.2 % — ABNORMAL LOW (ref 36.0–46.0)
HCT: 31.5 % — ABNORMAL LOW (ref 36.0–46.0)
Hemoglobin: 9.9 g/dL — ABNORMAL LOW (ref 12.0–15.0)
Hemoglobin: 10.9 g/dL — ABNORMAL LOW (ref 12.0–15.0)
MCH: 30.8 pg (ref 26.0–34.0)
MCHC: 35.1 g/dL (ref 30.0–36.0)
MCHC: 34.6 g/dL (ref 30.0–36.0)
MCV: 88.4 fL (ref 78.0–100.0)
Platelets: 142 10*3/uL — ABNORMAL LOW (ref 150–400)
RBC: 3.2 MIL/uL — ABNORMAL LOW (ref 3.87–5.11)
RDW: 15.1 % (ref 11.5–15.5)
RDW: 15.6 % — ABNORMAL HIGH (ref 11.5–15.5)
WBC: 17.7 10*3/uL — ABNORMAL HIGH (ref 4.0–10.5)
WBC: 10.9 10*3/uL — ABNORMAL HIGH (ref 4.0–10.5)

## 2011-08-31 LAB — PROTIME-INR: INR: 1.45 (ref 0.00–1.49)

## 2011-08-31 LAB — POCT I-STAT, CHEM 8
BUN: 9 mg/dL (ref 6–23)
Calcium, Ion: 0.98 mmol/L — ABNORMAL LOW (ref 1.12–1.32)
Chloride: 105 mEq/L (ref 96–112)
Sodium: 144 mEq/L (ref 135–145)

## 2011-08-31 LAB — DIC (DISSEMINATED INTRAVASCULAR COAGULATION)PANEL
Smear Review: NONE SEEN
aPTT: 46 seconds — ABNORMAL HIGH (ref 24–37)

## 2011-08-31 LAB — TYPE AND SCREEN

## 2011-08-31 LAB — MAGNESIUM: Magnesium: 3 mg/dL — ABNORMAL HIGH (ref 1.5–2.5)

## 2011-08-31 SURGERY — BENTALL PROCEDURE
Anesthesia: General | Site: Chest | Wound class: Clean

## 2011-08-31 MED ORDER — ALBUMIN HUMAN 5 % IV SOLN
INTRAVENOUS | Status: DC | PRN
  Administered 2011-08-31: 17:00:00 via INTRAVENOUS

## 2011-08-31 MED ORDER — ACETAMINOPHEN 160 MG/5ML PO SOLN
975.0000 mg | Freq: Four times a day (QID) | ORAL | Status: DC
Start: 2011-08-31 — End: 2011-08-31

## 2011-08-31 MED ORDER — BISACODYL 5 MG PO TBEC
10.0000 mg | DELAYED_RELEASE_TABLET | Freq: Every day | ORAL | Status: DC
  Administered 2011-09-01 – 2011-09-03 (×3): 10 mg via ORAL
  Filled 2011-08-31 (×3): qty 2

## 2011-08-31 MED ORDER — METOPROLOL TARTRATE 25 MG/10 ML ORAL SUSPENSION
12.5000 mg | Freq: Two times a day (BID) | ORAL | Status: DC
  Filled 2011-08-31 (×3): qty 5

## 2011-08-31 MED ORDER — MIDAZOLAM HCL 2 MG/2ML IJ SOLN: 2.0000 mg | INTRAMUSCULAR | Status: DC | PRN

## 2011-08-31 MED ORDER — SODIUM CHLORIDE 0.9 % IV SOLN
100.0000 [IU] | INTRAVENOUS | Status: DC | PRN
  Administered 2011-08-31: 1.2 [IU]/h via INTRAVENOUS

## 2011-08-31 MED ORDER — ACETAMINOPHEN 160 MG/5ML PO SOLN: 650.0000 mg | ORAL | Status: AC

## 2011-08-31 MED ORDER — SODIUM CHLORIDE 0.45 % IV SOLN: INTRAVENOUS | Status: DC

## 2011-08-31 MED ORDER — DEXTROSE 5 % IV SOLN
1.5000 g | Freq: Two times a day (BID) | INTRAVENOUS | Status: AC
  Administered 2011-08-31 – 2011-09-02 (×4): 1.5 g via INTRAVENOUS
  Filled 2011-08-31 (×4): qty 1.5

## 2011-08-31 MED ORDER — POTASSIUM CHLORIDE 10 MEQ/50ML IV SOLN
10.0000 meq | INTRAVENOUS | Status: AC
  Administered 2011-08-31 (×3): 10 meq via INTRAVENOUS

## 2011-08-31 MED ORDER — MILRINONE IN DEXTROSE 200-5 MCG/ML-% IV SOLN
0.3000 ug/kg/min | INTRAVENOUS | Status: DC
  Filled 2011-08-31: qty 100

## 2011-08-31 MED ORDER — MORPHINE SULFATE 4 MG/ML IJ SOLN
2.0000 mg | INTRAMUSCULAR | Status: DC | PRN
  Administered 2011-09-01 (×6): 2 mg via INTRAVENOUS
  Administered 2011-09-01: 4 mg via INTRAVENOUS
  Filled 2011-08-31 (×5): qty 1

## 2011-08-31 MED ORDER — SODIUM CHLORIDE 0.9 % IJ SOLN
3.0000 mL | Freq: Two times a day (BID) | INTRAMUSCULAR | Status: DC
  Administered 2011-09-01 – 2011-09-04 (×7): 3 mL via INTRAVENOUS

## 2011-08-31 MED ORDER — SODIUM CHLORIDE 0.9 % IV SOLN
1000.0000 mg | Freq: Once | INTRAVENOUS | Status: AC
  Administered 2011-09-01: 1000 mg via INTRAVENOUS
  Filled 2011-08-31: qty 1000

## 2011-08-31 MED ORDER — LACTATED RINGERS IV SOLN: INTRAVENOUS | Status: DC

## 2011-08-31 MED ORDER — ASPIRIN EC 325 MG PO TBEC
325.0000 mg | DELAYED_RELEASE_TABLET | Freq: Every day | ORAL | Status: DC
  Administered 2011-09-01 – 2011-09-05 (×5): 325 mg via ORAL
  Filled 2011-08-31 (×5): qty 1

## 2011-08-31 MED ORDER — CALCIUM CHLORIDE 10 % IV SOLN
1.0000 g | Freq: Once | INTRAVENOUS | Status: AC | PRN
  Filled 2011-08-31: qty 10

## 2011-08-31 MED ORDER — MAGNESIUM SULFATE 50 % IJ SOLN: 4.0000 g | Freq: Once | INTRAVENOUS | Status: DC

## 2011-08-31 MED ORDER — SODIUM CHLORIDE 0.9 % IJ SOLN
OROMUCOSAL | Status: DC | PRN
  Administered 2011-08-31: 11:00:00 via TOPICAL

## 2011-08-31 MED ORDER — SODIUM CHLORIDE 0.9 % IJ SOLN
OROMUCOSAL | Status: DC | PRN
  Administered 2011-08-31 (×2): via TOPICAL

## 2011-08-31 MED ORDER — VECURONIUM BROMIDE 10 MG IV SOLR
INTRAVENOUS | Status: DC | PRN
  Administered 2011-08-31 (×5): 5 mg via INTRAVENOUS

## 2011-08-31 MED ORDER — SODIUM CHLORIDE 0.9 % IV SOLN
INTRAVENOUS | Status: DC | PRN
  Administered 2011-08-31: 17:00:00 via INTRAVENOUS

## 2011-08-31 MED ORDER — METOPROLOL TARTRATE 12.5 MG HALF TABLET
12.5000 mg | ORAL_TABLET | Freq: Two times a day (BID) | ORAL | Status: DC
  Filled 2011-08-31 (×3): qty 1

## 2011-08-31 MED ORDER — SODIUM CHLORIDE 0.9 % IR SOLN
Status: DC | PRN
  Administered 2011-08-31: 1000 mL

## 2011-08-31 MED ORDER — MILRINONE IN DEXTROSE 200-5 MCG/ML-% IV SOLN
INTRAVENOUS | Status: DC | PRN
  Administered 2011-08-31: .33 ug/kg/min via INTRAVENOUS

## 2011-08-31 MED ORDER — SODIUM CHLORIDE 0.9 % IV SOLN
5.0000 g | Freq: Once | INTRAVENOUS | Status: DC
  Filled 2011-08-31: qty 20

## 2011-08-31 MED ORDER — MORPHINE SULFATE 2 MG/ML IJ SOLN
1.0000 mg | INTRAMUSCULAR | Status: AC | PRN
  Administered 2011-08-31: 2 mg via INTRAVENOUS
  Filled 2011-08-31: qty 1

## 2011-08-31 MED ORDER — ALBUMIN HUMAN 5 % IV SOLN
250.0000 mL | INTRAVENOUS | Status: AC | PRN
  Administered 2011-08-31 – 2011-09-01 (×4): 250 mL via INTRAVENOUS
  Filled 2011-08-31 (×3): qty 250

## 2011-08-31 MED ORDER — SODIUM CHLORIDE 0.9 % IV SOLN
20.0000 mg | INTRAVENOUS | Status: DC | PRN
  Administered 2011-08-31: 50 ug/min via INTRAVENOUS

## 2011-08-31 MED ORDER — LIDOCAINE HCL (CARDIAC) 20 MG/ML IV SOLN
INTRAVENOUS | Status: DC | PRN
  Administered 2011-08-31: 100 mg via INTRAVENOUS

## 2011-08-31 MED ORDER — PROTAMINE SULFATE 10 MG/ML IV SOLN
INTRAVENOUS | Status: DC | PRN
  Administered 2011-08-31: 200 mg via INTRAVENOUS

## 2011-08-31 MED ORDER — PHENYLEPHRINE HCL 10 MG/ML IJ SOLN
10.0000 mg | INTRAVENOUS | Status: DC | PRN
  Administered 2011-08-31: 20 ug/min via INTRAVENOUS

## 2011-08-31 MED ORDER — MUPIROCIN CALCIUM 2 % EX CREA
TOPICAL_CREAM | Freq: Two times a day (BID) | CUTANEOUS | Status: DC
  Administered 2011-08-31 – 2011-09-03 (×6): via TOPICAL
  Administered 2011-09-03 – 2011-09-04 (×2): 1 via TOPICAL
  Filled 2011-08-31: qty 15

## 2011-08-31 MED ORDER — SODIUM CHLORIDE 0.9 % IV SOLN
200.0000 ug | INTRAVENOUS | Status: DC | PRN
  Administered 2011-08-31: 0.2 ug/kg/h via INTRAVENOUS

## 2011-08-31 MED ORDER — PHENYLEPHRINE HCL 10 MG/ML IJ SOLN
0.0000 ug/min | INTRAMUSCULAR | Status: DC
  Administered 2011-09-01: 40 ug/min via INTRAVENOUS
  Filled 2011-08-31: qty 2

## 2011-08-31 MED ORDER — DOPAMINE-DEXTROSE 3.2-5 MG/ML-% IV SOLN: 0.0000 ug/kg/min | INTRAVENOUS | Status: DC

## 2011-08-31 MED ORDER — LACTATED RINGERS IV SOLN
INTRAVENOUS | Status: DC | PRN
  Administered 2011-08-31 (×3): via INTRAVENOUS

## 2011-08-31 MED ORDER — METOPROLOL TARTRATE 1 MG/ML IV SOLN: 2.5000 mg | INTRAVENOUS | Status: DC | PRN

## 2011-08-31 MED ORDER — FAMOTIDINE IN NACL 20-0.9 MG/50ML-% IV SOLN
20.0000 mg | Freq: Two times a day (BID) | INTRAVENOUS | Status: DC
  Administered 2011-08-31: 20 mg via INTRAVENOUS

## 2011-08-31 MED ORDER — MUPIROCIN CALCIUM 2 % EX CREA
TOPICAL_CREAM | Freq: Two times a day (BID) | CUTANEOUS | Status: DC
  Filled 2011-08-31: qty 15

## 2011-08-31 MED ORDER — NITROGLYCERIN IN D5W 200-5 MCG/ML-% IV SOLN: 0.0000 ug/min | INTRAVENOUS | Status: DC

## 2011-08-31 MED ORDER — BISACODYL 10 MG RE SUPP: 10.0000 mg | Freq: Every day | RECTAL | Status: DC

## 2011-08-31 MED ORDER — MIDAZOLAM HCL 5 MG/5ML IJ SOLN
INTRAMUSCULAR | Status: DC | PRN
  Administered 2011-08-31: 5 mg via INTRAVENOUS
  Administered 2011-08-31: 1 mg via INTRAVENOUS
  Administered 2011-08-31 (×4): 2 mg via INTRAVENOUS
  Administered 2011-08-31: 1 mg via INTRAVENOUS

## 2011-08-31 MED ORDER — SODIUM CHLORIDE 0.9 % IR SOLN
Status: DC | PRN
  Administered 2011-08-31: 3000 mL

## 2011-08-31 MED ORDER — FAMOTIDINE IN NACL 20-0.9 MG/50ML-% IV SOLN
20.0000 mg | Freq: Two times a day (BID) | INTRAVENOUS | Status: AC
  Administered 2011-08-31: 20 mg via INTRAVENOUS

## 2011-08-31 MED ORDER — ACETAMINOPHEN 500 MG PO TABS: 1000.0000 mg | ORAL_TABLET | Freq: Four times a day (QID) | ORAL | Status: DC

## 2011-08-31 MED ORDER — OXYCODONE HCL 5 MG PO TABS
5.0000 mg | ORAL_TABLET | ORAL | Status: DC | PRN
  Administered 2011-09-01: 5 mg via ORAL
  Administered 2011-09-02: 10 mg via ORAL
  Administered 2011-09-02 – 2011-09-04 (×3): 5 mg via ORAL
  Filled 2011-08-31 (×3): qty 1
  Filled 2011-08-31 (×2): qty 2

## 2011-08-31 MED ORDER — PANTOPRAZOLE SODIUM 40 MG PO TBEC
40.0000 mg | DELAYED_RELEASE_TABLET | Freq: Every day | ORAL | Status: DC
  Administered 2011-09-02 – 2011-09-05 (×4): 40 mg via ORAL
  Filled 2011-08-31 (×4): qty 1

## 2011-08-31 MED ORDER — ACETAMINOPHEN 160 MG/5ML PO SOLN
975.0000 mg | Freq: Four times a day (QID) | ORAL | Status: DC
  Filled 2011-08-31: qty 40.6

## 2011-08-31 MED ORDER — MILRINONE IN DEXTROSE 200-5 MCG/ML-% IV SOLN
0.3750 ug/kg/min | INTRAVENOUS | Status: DC
  Filled 2011-08-31: qty 100

## 2011-08-31 MED ORDER — SODIUM CHLORIDE 0.9 % IV SOLN: 250.0000 mL | INTRAVENOUS | Status: DC

## 2011-08-31 MED ORDER — HEMOSTATIC AGENTS (NO CHARGE) OPTIME
TOPICAL | Status: DC | PRN
  Administered 2011-08-31 (×2): 1 via TOPICAL

## 2011-08-31 MED ORDER — ONDANSETRON HCL 4 MG/2ML IJ SOLN
4.0000 mg | Freq: Four times a day (QID) | INTRAMUSCULAR | Status: DC | PRN
  Administered 2011-09-01 – 2011-09-02 (×4): 4 mg via INTRAVENOUS
  Filled 2011-08-31 (×4): qty 2

## 2011-08-31 MED ORDER — ROCURONIUM BROMIDE 100 MG/10ML IV SOLN
INTRAVENOUS | Status: DC | PRN
  Administered 2011-08-31: 40 mg via INTRAVENOUS
  Administered 2011-08-31: 100 mg via INTRAVENOUS
  Administered 2011-08-31: 20 mg via INTRAVENOUS

## 2011-08-31 MED ORDER — PROPOFOL 10 MG/ML IV EMUL
INTRAVENOUS | Status: DC | PRN
  Administered 2011-08-31: 150 mg via INTRAVENOUS

## 2011-08-31 MED ORDER — SODIUM CHLORIDE 0.9 % IJ SOLN
3.0000 mL | INTRAMUSCULAR | Status: DC | PRN
  Administered 2011-09-04: 3 mL via INTRAVENOUS

## 2011-08-31 MED ORDER — SODIUM CHLORIDE 0.9 % IV SOLN: INTRAVENOUS | Status: DC

## 2011-08-31 MED ORDER — SODIUM CHLORIDE 0.9 % IV SOLN
0.4000 ug/kg/h | INTRAVENOUS | Status: DC
  Administered 2011-08-31: 0.7 ug/kg/h via INTRAVENOUS
  Filled 2011-08-31: qty 2

## 2011-08-31 MED ORDER — HEPARIN SODIUM (PORCINE) 1000 UNIT/ML IJ SOLN
INTRAMUSCULAR | Status: DC | PRN
  Administered 2011-08-31: 18000 [IU] via INTRAVENOUS

## 2011-08-31 MED ORDER — ACETAMINOPHEN 500 MG PO TABS
1000.0000 mg | ORAL_TABLET | Freq: Four times a day (QID) | ORAL | Status: DC
  Administered 2011-09-01 – 2011-09-05 (×17): 1000 mg via ORAL
  Filled 2011-08-31 (×18): qty 2

## 2011-08-31 MED ORDER — DOPAMINE-DEXTROSE 3.2-5 MG/ML-% IV SOLN
INTRAVENOUS | Status: DC | PRN
  Administered 2011-08-31: 5 ug/kg/min via INTRAVENOUS

## 2011-08-31 MED ORDER — MAGNESIUM SULFATE 40 MG/ML IJ SOLN
INTRAMUSCULAR | Status: AC
  Administered 2011-08-31: 4 g via INTRAVENOUS
  Filled 2011-08-31: qty 100

## 2011-08-31 MED ORDER — SODIUM CHLORIDE 0.9 % IV SOLN
INTRAVENOUS | Status: DC
  Filled 2011-08-31: qty 1

## 2011-08-31 MED ORDER — FENTANYL CITRATE 0.05 MG/ML IJ SOLN
INTRAMUSCULAR | Status: DC | PRN
  Administered 2011-08-31 (×2): 100 ug via INTRAVENOUS
  Administered 2011-08-31: 150 ug via INTRAVENOUS
  Administered 2011-08-31: 400 ug via INTRAVENOUS
  Administered 2011-08-31: 50 ug via INTRAVENOUS
  Administered 2011-08-31 (×2): 100 ug via INTRAVENOUS
  Administered 2011-08-31: 250 ug via INTRAVENOUS
  Administered 2011-08-31 (×2): 100 ug via INTRAVENOUS
  Administered 2011-08-31: 200 ug via INTRAVENOUS

## 2011-08-31 MED ORDER — ASPIRIN 81 MG PO CHEW: 324.0000 mg | CHEWABLE_TABLET | Freq: Every day | ORAL | Status: DC

## 2011-08-31 MED ORDER — VANCOMYCIN HCL 1000 MG IV SOLR
1000.0000 mg | INTRAVENOUS | Status: DC | PRN
  Administered 2011-08-31: 1250 mg via INTRAVENOUS

## 2011-08-31 MED ORDER — CALCIUM CHLORIDE 10 % IV SOLN
INTRAVENOUS | Status: DC | PRN
  Administered 2011-08-31: 1 g via INTRAVENOUS

## 2011-08-31 MED ORDER — SODIUM CHLORIDE 0.9 % IV SOLN
10.0000 g | INTRAVENOUS | Status: DC | PRN
  Administered 2011-08-31: 5 g/h via INTRAVENOUS

## 2011-08-31 MED ORDER — ACETAMINOPHEN 650 MG RE SUPP
650.0000 mg | RECTAL | Status: AC
  Administered 2011-08-31: 650 mg via RECTAL

## 2011-08-31 MED ORDER — DOCUSATE SODIUM 100 MG PO CAPS
200.0000 mg | ORAL_CAPSULE | Freq: Every day | ORAL | Status: DC
  Administered 2011-09-01 – 2011-09-03 (×3): 200 mg via ORAL
  Filled 2011-08-31 (×3): qty 2

## 2011-08-31 MED ORDER — HEMOSTATIC AGENTS (NO CHARGE) OPTIME
TOPICAL | Status: DC | PRN
  Administered 2011-08-31: 1 via TOPICAL

## 2011-08-31 MED ORDER — SODIUM CHLORIDE 0.9 % IV SOLN
0.1000 ug/kg/h | INTRAVENOUS | Status: DC
  Filled 2011-08-31: qty 2

## 2011-08-31 SURGICAL SUPPLY — 141 items
1/2X3/8X1/2Y CONNECTOR ×1 IMPLANT
34/38FR VENOUS CANNULA VC2 ×1 IMPLANT
ADAPTER CARDIO PERF ANTE/RETRO (ADAPTER) ×2 IMPLANT
ADH SKN CLS APL DERMABOND .7 (GAUZE/BANDAGES/DRESSINGS) ×2
ADH SRG 12 PREFL SYR 3 SPRDR (MISCELLANEOUS) ×1
ADPR PRFSN 84XANTGRD RTRGD (ADAPTER) ×1
ANTEGRADE CPLG (MISCELLANEOUS) ×1 IMPLANT
APL SKNCLS STERI-STRIP NONHPOA (GAUZE/BANDAGES/DRESSINGS) ×1
ATTRACTOMAT 16X20 MAGNETIC DRP (DRAPES) ×2 IMPLANT
BAG DECANTER FOR FLEXI CONT (MISCELLANEOUS) ×2 IMPLANT
BENZOIN TINCTURE PRP APPL 2/3 (GAUZE/BANDAGES/DRESSINGS) ×2 IMPLANT
BLADE STERNUM SYSTEM 6 (BLADE) ×2 IMPLANT
BLADE SURG 11 STRL SS (BLADE) ×2 IMPLANT
CANISTER SUCTION 2500CC (MISCELLANEOUS) ×4 IMPLANT
CANN STRAIGHTSHOT ART ANGLE (CANNULA) IMPLANT
CANN STRAIGHTSHOT ART STRT (CANNULA) IMPLANT
CANNULA FEM ARTERIAL 21F (MISCELLANEOUS) IMPLANT
CANNULA FEM VENOUS REMOTE 22FR (CANNULA) IMPLANT
CANNULA GUNDRY RCSP 15FR (MISCELLANEOUS) ×2 IMPLANT
CANNULA OPTISITE PERFUSION 16F (CANNULA) ×2 IMPLANT
CANNULA OPTISITE PERFUSION 18F (CANNULA) ×2 IMPLANT
CANNULA VEN 2 STAGE (MISCELLANEOUS) ×1 IMPLANT
CARDIAC SUCTION (MISCELLANEOUS) ×2 IMPLANT
CATH AORTIC ROOT CAL-MED ×1 IMPLANT
CATH ENDO VENT PULMONARY (CATHETERS) ×1 IMPLANT
CATH ENDOVENT PULMONARY (CATHETERS) ×2 IMPLANT
CATH HEART VENT LEFT (CATHETERS) IMPLANT
CATH ROBINSON RED A/P 18FR (CATHETERS) ×5 IMPLANT
CLOTH BEACON ORANGE TIMEOUT ST (SAFETY) ×2 IMPLANT
CONN ST 1/4X3/8  BEN (MISCELLANEOUS) ×1
CONN ST 1/4X3/8 BEN (MISCELLANEOUS) ×1 IMPLANT
CONT SPEC STER OR (MISCELLANEOUS) ×2 IMPLANT
COVER MAYO STAND STRL (DRAPES) ×2 IMPLANT
COVER SURGICAL LIGHT HANDLE (MISCELLANEOUS) ×4 IMPLANT
CRADLE DONUT ADULT HEAD (MISCELLANEOUS) ×2 IMPLANT
DERMABOND ADVANCED (GAUZE/BANDAGES/DRESSINGS) ×2
DERMABOND ADVANCED .7 DNX12 (GAUZE/BANDAGES/DRESSINGS) ×2 IMPLANT
DEVICE PMI PUNCTURE CLOSURE (MISCELLANEOUS) ×1 IMPLANT
DEVICE TROCAR PUNCTURE CLOSURE (ENDOMECHANICALS) ×2 IMPLANT
DRAIN CHANNEL 28F RND 3/8 FF (WOUND CARE) ×4 IMPLANT
DRAPE BILATERAL SPLIT (DRAPES) ×2 IMPLANT
DRAPE C-ARM 42X72 X-RAY (DRAPES) ×2 IMPLANT
DRAPE CV SPLIT W-CLR ANES SCRN (DRAPES) ×2 IMPLANT
DRAPE INCISE IOBAN 66X45 STRL (DRAPES) ×2 IMPLANT
DRAPE SLUSH/WARMER DISC (DRAPES) IMPLANT
DRSG COVADERM 4X8 (GAUZE/BANDAGES/DRESSINGS) ×2 IMPLANT
ELECT BLADE 6.5 EXT (BLADE) ×2 IMPLANT
ELECT REM PT RETURN 9FT ADLT (ELECTROSURGICAL) ×4
ELECTRODE REM PT RTRN 9FT ADLT (ELECTROSURGICAL) ×2 IMPLANT
FEMORAL VENOUS CANN RAP (CANNULA) IMPLANT
GLOVE BIO SURGEON STRL SZ 6.5 (GLOVE) ×3 IMPLANT
GLOVE ORTHO TXT STRL SZ7.5 (GLOVE) ×6 IMPLANT
GOWN BRE IMP SLV AUR XL STRL (GOWN DISPOSABLE) ×3 IMPLANT
GOWN STRL NON-REIN LRG LVL3 (GOWN DISPOSABLE) ×10 IMPLANT
HEMOSTAT SURGICEL 2X4 FIBR (HEMOSTASIS) ×1 IMPLANT
INSERT CONFORM CROSS CLAMP 66M (MISCELLANEOUS) ×2 IMPLANT
INSERT CONFORM CROSS CLAMP 86M (MISCELLANEOUS) ×2 IMPLANT
INSERT FOGARTY XLG (MISCELLANEOUS) ×1 IMPLANT
IV NS 1000ML (IV SOLUTION) ×2
IV NS 1000ML BAXH (IV SOLUTION) IMPLANT
KIT BASIN OR (CUSTOM PROCEDURE TRAY) ×2 IMPLANT
KIT DILATOR VASC 18G NDL (KITS) ×2 IMPLANT
KIT FEM CANN BIOMEDICUS 17FR (MISCELLANEOUS) IMPLANT
KIT FEM CANN BIOMEDICUS 19FR (MISCELLANEOUS) IMPLANT
KIT ROOM TURNOVER OR (KITS) ×2 IMPLANT
KIT SUCTION CATH 14FR (SUCTIONS) ×2 IMPLANT
KIT TOURNIQUET VASCULAR (MISCELLANEOUS) ×1 IMPLANT
LEAD PACING MYOCARDI (MISCELLANEOUS) ×2 IMPLANT
LEFT HEART VENT CATHETER ×1 IMPLANT
LINE VENT (MISCELLANEOUS) ×1 IMPLANT
NDL AORTIC ROOT 14G 7F (CATHETERS) ×1 IMPLANT
NEEDLE AORTIC AIR ASPIRATING (NEEDLE) ×1 IMPLANT
NEEDLE AORTIC ROOT 14G 7F (CATHETERS) ×2 IMPLANT
NS IRRIG 1000ML POUR BTL (IV SOLUTION) ×10 IMPLANT
PACK OPEN HEART (CUSTOM PROCEDURE TRAY) ×2 IMPLANT
PAD ARMBOARD 7.5X6 YLW CONV (MISCELLANEOUS) ×2 IMPLANT
PAD ELECT DEFIB RADIOL ZOLL (MISCELLANEOUS) ×2 IMPLANT
PAD SHARPS MAGNETIC DISPOSAL (MISCELLANEOUS) ×1 IMPLANT
RETRACTOR TRL SOFT TISSUE LG (INSTRUMENTS) IMPLANT
RETRACTOR TRM SOFT TISSUE 7.5 (INSTRUMENTS) IMPLANT
RUBBERBAND STERILE (MISCELLANEOUS) IMPLANT
SET CANNULATION TOURNIQUET (MISCELLANEOUS) ×2 IMPLANT
SET CARDIOPLEGIA MPS 5001102 (MISCELLANEOUS) ×1 IMPLANT
SET IRRIG TUBING LAPAROSCOPIC (IRRIGATION / IRRIGATOR) ×2 IMPLANT
SOLUTION ANTI FOG 6CC (MISCELLANEOUS) ×2 IMPLANT
SPONGE GAUZE 4X4 12PLY (GAUZE/BANDAGES/DRESSINGS) ×2 IMPLANT
SPONGE LAP 4X18 X RAY DECT (DISPOSABLE) ×1 IMPLANT
STREAMLINE BIPOLAR TEMPORARY PACING LEAD ×1 IMPLANT
STRIP CLOSURE SKIN 1/2X4 (GAUZE/BANDAGES/DRESSINGS) ×1 IMPLANT
SUCKER WEIGHTED FLEX (MISCELLANEOUS) ×1 IMPLANT
SURGIFLO TRUKIT (HEMOSTASIS) ×1 IMPLANT
SUT BONE WAX W31G (SUTURE) ×2 IMPLANT
SUT ETHIBON 2 0 V 52N 30 (SUTURE) ×3 IMPLANT
SUT ETHIBOND 2 0 SH (SUTURE) ×8 IMPLANT
SUT ETHIBOND 2 0 SH 36X2 (SUTURE) IMPLANT
SUT ETHIBOND NAB MH 2-0 36IN (SUTURE) ×3 IMPLANT
SUT ETHIBOND X763 2 0 SH 1 (SUTURE) ×3 IMPLANT
SUT GORETEX CV 4 TH 22 36 (SUTURE) IMPLANT
SUT GORETEX CV4 TH-18 (SUTURE) ×4 IMPLANT
SUT MNCRL AB 3-0 PS2 18 (SUTURE) ×4 IMPLANT
SUT PDS AB 1 CTX 36 (SUTURE) ×2 IMPLANT
SUT PROLENE 3 0 SH DA (SUTURE) ×11 IMPLANT
SUT PROLENE 3 0 SH1 36 (SUTURE) ×8 IMPLANT
SUT PROLENE 4 0 RB 1 (SUTURE) ×26
SUT PROLENE 4 0 SH DA (SUTURE) ×2 IMPLANT
SUT PROLENE 4-0 RB1 .5 CRCL 36 (SUTURE) ×4 IMPLANT
SUT PROLENE 5 0 C 1 36 (SUTURE) ×13 IMPLANT
SUT PROLENE 6 0 C 1 30 (SUTURE) ×7 IMPLANT
SUT SILK  1 MH (SUTURE) ×7
SUT SILK 1 MH (SUTURE) IMPLANT
SUT SILK 1 TIES 10X30 (SUTURE) ×2 IMPLANT
SUT SILK 2 0 SH CR/8 (SUTURE) ×2 IMPLANT
SUT SILK 2 0 TIES 10X30 (SUTURE) ×2 IMPLANT
SUT SILK 2 0SH CR/8 30 (SUTURE) ×4 IMPLANT
SUT SILK 3 0 (SUTURE) ×2
SUT SILK 3 0 SH CR/8 (SUTURE) ×2 IMPLANT
SUT SILK 3 0SH CR/8 30 (SUTURE) ×4 IMPLANT
SUT SILK 3-0 18XBRD TIE 12 (SUTURE) ×1 IMPLANT
SUT TEM PAC WIRE 2 0 SH (SUTURE) ×4 IMPLANT
SUT VIC AB 2-0 CTX 36 (SUTURE) ×4 IMPLANT
SUT VIC AB 2-0 UR6 27 (SUTURE) ×4 IMPLANT
SUT VIC AB 3-0 SH 8-18 (SUTURE) ×8 IMPLANT
SUT VICRYL 2 TP 1 (SUTURE) ×4 IMPLANT
SYR 10ML KIT SKIN ADHESIVE (MISCELLANEOUS) ×1 IMPLANT
SYRINGE 10CC LL (SYRINGE) ×2 IMPLANT
SYSTEM SAHARA CHEST DRAIN ATS (WOUND CARE) ×2 IMPLANT
TOWEL OR 17X24 6PK STRL BLUE (TOWEL DISPOSABLE) ×2 IMPLANT
TOWEL OR 17X26 10 PK STRL BLUE (TOWEL DISPOSABLE) ×2 IMPLANT
TRAY FOLEY IC TEMP SENS 14FR (CATHETERS) ×2 IMPLANT
TROCAR XCEL BLADELESS 5X75MML (TROCAR) ×2 IMPLANT
TROCAR XCEL NON-BLD 11X100MML (ENDOMECHANICALS) ×4 IMPLANT
TUBE CONNECTING 12X1/4 (SUCTIONS) ×1 IMPLANT
TUBE SUCT INTRACARD DLP 20F (MISCELLANEOUS) ×2 IMPLANT
UNDERPAD 30X30 INCONTINENT (UNDERPADS AND DIAPERS) ×2 IMPLANT
VALVE AORTIC 21MM (Prosthesis & Implant Heart) ×1 IMPLANT
VALVE AORTIC TOP HAT (Prosthesis & Implant Heart) ×2 IMPLANT
VALVE AORTIC TOP HAT 23 (Prosthesis & Implant Heart) IMPLANT
VASCULAR DILATOR KIT ×1 IMPLANT
VENT LEFT HEART 12002 (CATHETERS) ×2
WATER STERILE IRR 1000ML POUR (IV SOLUTION) ×4 IMPLANT
YANKAUER SUCT BULB TIP NO VENT (SUCTIONS) ×1 IMPLANT

## 2011-08-31 NOTE — Transfer of Care (Signed)
Immediate Anesthesia Transfer of Care Note  Patient: Zoe Matthews [facility]  Procedure(s) Performed:  BENTALL PROCEDURE - aortic root replacement  Patient Location: SICU  Anesthesia Type: General  Level of Consciousness: unresponsive  Airway & Oxygen Therapy: Patient remains intubated per anesthesia plan and Patient placed on Ventilator (see vital sign flow sheet for setting)  Post-op Assessment: Post -op Vital signs reviewed and stable and report given to ICU nurse  Post vital signs: Reviewed and stable  Complications: No apparent anesthesia complications

## 2011-08-31 NOTE — Brief Op Note (Signed)
08/31/2011  5:28 PM  PATIENT:  Zoe Matthews  50 y.o. female  PRE-OPERATIVE DIAGNOSIS:  AORTIC STENOSIS  POST-OPERATIVE DIAGNOSIS:  aortic stenosis  PROCEDURE:  Procedure(s): BENTALL PROCEDURE  SURGEON:  Surgeon(s): Purcell Nails, MD  ASSISTANT:   Doree Fudge, PA-C  ANESTHESIA:  Achille Rich, MD  COUNTS:  correct  FINDINGS:    Bicuspid aortic valve with severe aortic stenosis  Normal LV Function  Severe calcification of aortic annulus  Bleeding from aortic root after initial AVR requiring aortic root replacement  PATIENT DISPOSITION:  To SICU stable  [name]

## 2011-08-31 NOTE — Anesthesia Preprocedure Evaluation (Signed)
Anesthesia Evaluation  Patient identified by MRN, date of birth, ID band Patient awake    Reviewed: Allergy & Precautions, H&P , NPO status , Patient's Chart, lab work & pertinent test results  History of Anesthesia Complications (+) PONV  Airway Mallampati: II  Neck ROM: full    Dental   Pulmonary shortness of breath,          Cardiovascular + Valvular Problems/Murmurs AS     Neuro/Psych    GI/Hepatic   Endo/Other    Renal/GU      Musculoskeletal   Abdominal   Peds  Hematology   Anesthesia Other Findings   Reproductive/Obstetrics                           Anesthesia Physical Anesthesia Plan  ASA: III  Anesthesia Plan: General   Post-op Pain Management:    Induction: Intravenous  Airway Management Planned: Oral ETT  Additional Equipment: TEE, Arterial line, CVP, PA Cath and Ultrasound Guidance Line Placement  Intra-op Plan:   Post-operative Plan:   Informed Consent: I have reviewed the patients History and Physical, chart, labs and discussed the procedure including the risks, benefits and alternatives for the proposed anesthesia with the patient or authorized representative who has indicated his/her understanding and acceptance.   Dental advisory given  Plan Discussed with: CRNA and Surgeon  Anesthesia Plan Comments:         Anesthesia Quick Evaluation

## 2011-08-31 NOTE — H&P (View-Only) (Signed)
OWEN,CLARENCE H, MD  08/18/2011  2:25 PM  Signed  PCP is NEAL,W RONALD, MD Referring Provider is Tilley, W Spencer Jr., *    Chief Complaint   Patient presents with   .  Aortic Stenosis       Referral from Dr Tilley for surgical eval, Aortic stensos, Cath'ed 08/17/11     HPI: Patient is a 50-year-old married white female from [place] with known history of bicuspid aortic valve and aortic stenosis. The patient was noted to have a heart murmur at birth and has been followed carefully ever since. For the past several years the patient has had routine followup echocardiograms with Dr. Tilley. Over the last 3 months the patient has developed exertional shortness of breath. Followup echocardiogram performed 08/09/2011 revealed progression of severity of aortic stenosis the point where the patient now has quite severe aortic stenosis. The peak velocity across the aortic valve measured 5.5 m/s. Peak and mean transvalvular gradients were estimated to be 120 and 78 mm mercury respectively. The patient has concentric left ventricular hypertrophy with normal left ventricular systolic function. There was trace mitral regurgitation and mild tricuspid regurgitation. The patient subsequently underwent left and right heart catheterization on 08/17/2011. This confirmed the presence of severe aortic stenosis with a mean gradient across the valve measured 57 mm mercury and peak to peak gradient 92 mm mercury. Aortic valve area was estimated 0.51 cm. Pulmonary artery pressures measured 30/11 with a pulmonary A wedge pressure of 12. The patient was noted to have normal coronary artery anatomy with no significant coronary artery disease. 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 The descending thoracic and abdominal aorta and iliac vessels was normal and free of any significant aortoiliac disease. The patient has been referred for elective aortic valve replacement.    Past Medical History   Diagnosis   Date   .  AS (aortic stenosis)     .  Bicuspid aortic valve         Past Surgical History   Procedure  Date   .  Cardiac catheterization  08/17/11       normal coronaries, critical AS   .  Cyst left breast removed         Family History   Problem  Relation  Age of Onset   .  Diabetes  Father     .  Hyperlipidemia  Father     .  Hypertension  Father       Social History History   Substance Use Topics   .  Smoking status:  Never Smoker    .  Smokeless tobacco:  Never Used   .  Alcohol Use:  No       Current Outpatient Prescriptions   Medication  Sig  Dispense  Refill   .  B Complex Vitamins (VITAMIN-B COMPLEX) TABS  Take 1 tablet by mouth 1 day or 1 dose.           .  calcium carbonate (OS-CAL) 600 MG TABS  Take 600 mg by mouth 2 (two) times daily with a meal.           .  Cholecalciferol (VITAMIN D3) 2000 UNITS capsule  Take 2,000 Units by mouth daily.           .  Magnesium 250 MG TABS  Take 1 tablet by mouth 1 day or 1 dose.           .  Multiple Vitamin (  MULTIVITAMIN) tablet  Take 1 tablet by mouth daily.               Allergies   Allergen  Reactions   .  Codeine  Nausea And Vomiting   .  Sulfa Antibiotics  Hives     Review of Systems: The patient reports normal appetite. She is 5 foot 1 inches tall and weighs approximately 146 pounds. She has been actively trying to lose weight with a combination of diet and exercise, and she is been successful in having lost 25 pounds over last 6 months to a year. The patient reports a three-month history of exertional shortness of breath. Shortness of breath occurs with moderate physical activity. The patient denies resting shortness of breath, PND, orthopnea, or lower extremity edema. The patient has occasional brief palpitations. The patient denies any chest tightness chest pressure either with activity or at rest. Patient denies any productive cough, hemoptysis, wheezing. Patient reports no difficulty swallowing. She denies symptoms  of reflux. Bowel function is regular. She has no history of any hematochezia, hematemesis, melena. Genitourinary urinary review of systems is normal with no history of urinary urgency or frequency. Patient denies arthritis or arthralgias. The patient denies pain in her legs with walking. She denies any dizzy spells or syncopal episodes. She denies any transient visual disturbances. Patient denies any transient numbness or weakness involving either upper or lower extremity. She has good dentition and sees his dentist on a regular basis. She has no recent visual disturbances. The remainder of her review of systems unremarkable.  BP 126/82  Pulse 90  Resp 20  Ht 5' 1" (1.549 m)  Wt 146 lb (66.225 kg)  BMI 27.59 kg/m2  SpO2 99%  LMP 08/15/2011 Physical Exam: On physical exam the patient is a well-appearing female who appears his stated age in no acute distress. HEENT exam is unrevealing. The neck is supple. There is no palpable lymphadenopathy. There no carotid bruits. Auscultation of the chest demonstrates clear breath sounds which are symmetrical bilaterally. Cardiovascular exam is notable for regular rate and rhythm. There is a prominent grade 3/6 crescendo decrescendo systolic murmur heard best along the sternal border. No diastolic murmurs are noted. The abdomen is soft slightly obese and nontender. There are no palpable masses. Bowel sounds are present. Extremities are warm and well-perfused. Patient is tender in the right groin from cardiac catheterization. Femoral pulse on the left is palpable but somewhat diminished. There is no lower extremity edema. The skin is clean dry and healthy appearing throughout. Rectal and GU exams are both deferred. Neurologic examination is grossly nonfocal and symmetrical throughout.  Diagnostic Tests: Report of 2-D echocardiogram performed 08/09/2011 is reviewed. This confirmed the presence of bicuspid aortic valve with severe aortic stenosis. Peak velocity across  the valve was 5.5 m/s. Peak and mean transvalvular gradients were estimated 120 and 78 mm mercury respectively. Aortic valve area was estimated 0.4 cm. Left ventricular outflow tract diameter was reportedly 1.9 cm. Left ventricular systolic function with ejection fraction estimated at 55%. There is mild to moderate concentric left ventricular hypertrophy. There was mild to moderate left atrial chamber enlargement. There is trace mitral regurgitation. There is mild tricuspid regurgitation. No other abnormalities were noted. In comparison with reports from previous echocardiograms in April 2012 degree of aortic stenosis have increased further.  Left and right heart catheterization performed 08/17/2011 is reviewed. This confirmed the presence of severe aortic stenosis with mean gradient across the valve measured 57 mm mercury. Aortic valve   area was estimated 0.51 cm. Pulmonary artery pressures measured 30/11 with pulmonary A wedge pressure 12 and central venous pressure was 3. There is normal coronary artery anatomy with right dominant coronary circulation. There is no significant coronary artery disease. There is mild dilatation of the a setting aorta but no significant aneurysm formation. The descending thoracic aorta and abdominal aorta are normal. Iliac vessels are normal. There is no significant aortoiliac disease.  Impression: Bicuspid native aortic valve with severe aortic stenosis. The patient has developed symptoms of exertional shortness of breath. She is normal left ventricular function and normal coronary artery anatomy with no significant coronary artery disease. I agree that the patient needs elective aortic valve replacement.   Plan:  I have reviewed the indications, risks, and potential benefits of surgery with the patient and her husband here in the office today.  We spent in excess of 30 minutes during her consultation today. Alternative treatment strategies were discussed. They understand  and accept all potential risks of surgery including but not limited to risk of death, stroke or other neurologic complication, myocardial infarction, congestive heart failure, respiratory failure, renal failure, bleeding requiring transfusion and/or reexploration, arrhythmia, infection or other wound complications, pneumonia, pleural and/or pericardial effusion, pulmonary embolus, aortic dissection or other major vascular complication, or delayed complications related to replacement.    The patient hopes to have her surgery performed via minimally invasive approach. We plan to proceed with surgery on Thursday and initially carefully evaluate the size of her aortic annulus and aortic root using transesophageal echocardiogram. If it appears that the aortic root size is in fact relatively small we will proceed with a conventional sternotomy. If the aortic root size appears to be adequate, we will proceed with the mini thoracotomy approach.  The patient specifically requests that her valve be replaced using a mechanical prosthesis. She understands and accepts all associated risks related to long-term anticoagulation.  All of their questions have been answered.      

## 2011-08-31 NOTE — Interval H&P Note (Signed)
History and Physical Interval Note:   08/31/2011   7:30 AM   Zoe Matthews  has presented today for surgery, with the diagnosis of AORTIC STENOSIS  The various methods of treatment have been discussed with the patient and family. After consideration of risks, benefits and other options for treatment, the patient has consented to  Procedure(s): MINIMALLY INVASIVE AORTIC VALVE REPLACEMENT (AVR) as a surgical intervention .  The patients' history has been reviewed, patient examined, no change in status, stable for surgery.  I have reviewed the patients' chart and labs.  Questions were answered to the patient's satisfaction.     Purcell Nails  MD

## 2011-08-31 NOTE — Op Note (Deleted)
Preoperative diagnosis: Left pleural effusion  Postoperative diagnosis: Same  Procedure:  Flexible Fiberoptic Bronchoscopy with Endobronchial Lavage  Placement Left Pleur-X Catheter  Placement Left Chest Tube  Surgeon: Purcell Nails  Assistant: n/a  Anesthesia: Raiford Simmonds, MD  Procedure Note:  The patient is brought to the operating room on the above-mentioned date and general endotracheal anesthesia is induced uneventfully. The, procedure is performed. Flexible fiberoptic bronchoscopy is performed through the patient's existing endotracheal tube. The bronchoscope was passed through the tube and the distal trachea, carina, and left and right endobronchial trees are visualized. There are no endobronchial abnormalities. Anatomy is normal. There is no erythema nor other signs of inflammation to suggest ongoing infection. There are trivial secretions. The left endobronchial tree was lavaged with copious saline solution. Endobronchial lavage is trapped and sent for routine culture as well as cytology.  A soft roll was placed behind the patient's left scapula. The patient's left lateral chest was prepared and draped in a sterile manner. 10 mL of 1% lidocaine solution is utilized to anesthetize the skin and subcutaneous tissues overlying the mid axillary line in approximately the sixth intercostal space. An Angiocath was passed through the interspace into the pleural space and of flexible guidewire is advanced into the pleural space. Appropriate positioning inferiorly in the pleural spaces verified with fluoroscopy. A second incision is placed more anteriorly. Pleurx catheter is tunneled from the anterior to the posterior incision. The dilators are passed over the guidewire and introducer and sheath passed over the guidewire. The Pleurx catheter is advanced into the pleural space and introducing sheath removed. The Pleurx catheter is secured to the skin in the posterior incision is closed with  a single suture. The Pleurx catheter is connected to a vacuum bottle. 200 mL of thin serosanguineous fluid is evacuated.  A small incision is made just above the level of the Pleurx catheter and a 28 French chest tube was placed directly through the incision into the pleural space posteriorly. The chest tube was secured to the skin with silk suture. The chest tube was fixed to close suction drainage device. A portable chest x-ray performed in the operating room to verify adequate reexpansion of the lung. The patient is transported to the postanesthesia care in after uneventful extubation in the operating room.  There were no intraoperative complications. All sponge instrument and needle counts were verified correct. Estimated blood loss was trivial. Patient was transported to the postanesthesia care unit in stable condition.  OWEN,CLARENCE H  This note was entered incorrectly on the wrong patient.  Will delete.  OWEN,CLARENCE H

## 2011-08-31 NOTE — Procedures (Signed)
Extubation Procedure Note  Patient Details:   Name: Zoe Matthews DOB: 06-Jul-1961 MRN: [id number]   Airway Documentation:  AIRWAYS 37 mm (Active)  Secured at (cm) 22 cm 08/31/2011 12:00 AM     AIRWAYS 8 mm (Active)  Secured at (cm) 23 cm 08/31/2011 12:00 AM     Airway 8 mm (Active)  Secured at (cm) 20 cm 08/31/2011  7:00 PM  Measured From Lips 08/31/2011  7:00 PM  Secured Location Right 08/31/2011  6:15 PM  Secured By [employer] Tape 08/31/2011  7:00 PM  Site Condition Dry 08/31/2011  6:15 PM     Airway (Active)    Evaluation  O2 sats: stable throughout Complications: No apparent complications Patient did tolerate procedure well. Bilateral Breath Sounds: Clear;Diminished   Yes NIF-36/FVC 660cc.Pt placed on 4lpm [place].  Zoe Matthews 08/31/2011, 10:48 PM

## 2011-08-31 NOTE — Op Note (Signed)
CARDIOTHORACIC SURGERY OPERATIVE NOTE  Date of Procedure: 08/31/2011  Preoperative Diagnosis: Severe Aortic Stenosis   Postoperative Diagnosis: Same    Procedure:    Bentall Aortic Root Replacement  21 mm Carbomedics Valsalva mechanical valve conduit  Reimplantation of left main and right coronary artery    Surgeon: Salvatore Decent. Cornelius Moras, MD Assistant: Doree Fudge, PA-C Anesthesia: Raiford Simmonds, MD   Operative Findings:  Bicuspid aortic valve with severe aortic stenosis  Normal left ventricular systolic function  Severe calcification of aortic annulus  Bleeding from aortic root after initial AVR that required aortic root replacement    BRIEF CLINICAL NOTE AND INDICATIONS FOR SURGERY  Patient is a 50 year old female with known history of bicuspid aortic valve and aortic stenosis. The patient has been followed carefully for years with serial echocardiograms. Recently the patient has developed exertional shortness of breath. Echocardiogram reveals severe aortic stenosis with normal left ventricular function. Left and right heart catheterization confirmed the presence of severe aortic stenosis. Mean gradient and peak to peak gradient across the aortic valve measured at catheterization were 59 and 92 mm mercury respectively. Peak velocity across the aortic valve by echocardiogram was 5.5 m/s corresponding to peak and mean transvalvular gradients of 120 and 78 mm mercury respectively. Cardiac catheterization demonstrates normal coronary artery anatomy with no significant coronary artery disease. A full consultation note has been dictated previously. The patient provides full informed consent for the operation as described. The patient specifically requests that her valve be replaced using a mechanical prosthesis.   DETAILS OF THE OPERATIVE PROCEDURE  The patient is brought to the operating room on the above mentioned date and central monitoring was established by the  anesthesia team including placement of Swan-Ganz catheter and radial arterial line. The patient is placed in the supine position on the operating table.  Intravenous antibiotics are administered. General endotracheal anesthesia is induced uneventfully. A Foley catheter is placed.  Baseline transesophageal echocardiogram was performed.  Findings were notable for bicuspid native aortic valve with severe aortic stenosis. There is severe calcification in the valve and the aortic annulus which is quite thick. Just below the aortic annulus the left ventricular output tract measures 1.9 cm. There is normal left ventricular function. There is mild left ventricular hypertrophy. There is mild mitral regurgitation. No other significant abnormalities are noted. Because of the relatively small size of the aortic annulus and the degree of heavy calcification, conventional sternotomy is felt to be most appropriate approach and plan for mini thoracotomy approach are canceled.  The patient's chest, abdomen, both groins, and both lower extremities are prepared and draped in a sterile manner. A time out procedure is performed.  A median sternotomy incision was performed and the pericardium is opened. The ascending aorta is normal in appearance. The ascending aorta and the right atrium are cannulated for cardioplegia bypass.  Adequate heparinization is verified.   A retrograde cardioplegia cannula is placed through the right atrium into the coronary sinus.  The entire pre-bypass portion of the operation was notable for stable hemodynamics.  Cardiopulmonary bypass was begun and the surface of the heart is inspected.  A left ventricular vent was placed through the right superior pulmonary vein. A cardioplegia cannula is placed in the ascending aorta.  A temperature probe was placed in the interventricular septum.  The patient is cooled to 30C systemic temperature.  The aortic cross clamp is applied and cold blood  cardioplegia is delivered initially in an antegrade fashion through the aortic root.  Supplemental cardioplegia is given retrograde through the coronary sinus catheter.  Iced saline slush is applied for topical hypothermia.  The initial cardioplegic arrest is rapid with early diastolic arrest.  Repeat doses of cardioplegia are administered intermittently throughout the entire cross clamp portion of the operation through the aortic root and through the coronary sinus catheter in order to maintain completely flat electrocardiogram and septal myocardial temperature below 15C.  Myocardial protection was felt to be excellent.  An oblique transverse aortotomy incision was performed.  The aortic valve was inspected and notable for severe aortic stenosis.  The valve is bicuspid and both leaflets are extremely thickened. There is severe calcification..  The aortic valve leaflets were excised sharply and the aortic annulus decalcified.  Decalcification was notably very tedious with extensive calcification extending into the annulus itself, particularly near the commissure between the left and non-coronary sinus of Valsalva..  The aortic annulus was sized to accept a 23 mm prosthesis.  The aortic root and left ventricle were irrigated with copious cold saline solution.  Aortic valve replacement was performed using interrupted horizontal mattress 2-0 Ethibond pledgeted sutures with pledgets in the subannular position.  A Sorin Carbomedics mechanical valve (size 23 mm) was implanted uneventfully. The valve seated appropriately with adequate space beneath the left main and right coronary artery.  The aortotomy was closed using a 2-layer closure of running 4-0 Prolene suture.  One final dose of warm retrograde "hot shot" cardioplegia was administered retrograde through the coronary sinus catheter while all air was evacuated through the aortic root.  The aortic cross clamp was removed after a total cross clamp time of 74  minutes.  Epicardial pacing wires are fixed to the right ventricular outflow tract and to the right atrial appendage. The patient is rewarmed to 37C temperature. The aortic and left ventricular vents are removed.    At this juncture there appeared to be an unacceptable amount of bleeding from deep in the mediastinum beneath the aorta. On inspection it seemed to be emanating from the dome of the left atrium at the base of left atrial appendage. It is felt that perhaps placement of a left ventricular vent had caused an injury in this area. Attempts to place sutures directly did not satisfactorily fixed the problem. Because of the proximity to the left main coronary artery, further attempts at direct suture without resting the heart for improved visualization was felt unwarranted.  An antegrade cardioplegic cannula is replaced and the aorta and cold blood cardioplegia is delivered in antegrade fashion through the aortic root. Again on direct visualization the bleeding seemed to be emanating from the dome of the left atrium. To visualize this better, a left atriotomy incision was performed posteriorly to the intra-atrial groove and the dome of the left atrium was inspected from the inside. There was no sign of any injury. All of the previous placed sutures were removed, and there was no sign of any injury within the left atrium or left atrial appendage. Another dose of antegrade cardioplegia is administered directly into the aortic root. On further inspection it appears that the bleeding is emanating from just beneath the aortic root itself, presumably from valve or valve placement sutures just beneath the commissure between the left and non-coronary sinus of Valsalva. The left atriotomy incision is closed leaving a sump drain across the mitral valve service the left ventricular vent.  A retrograde cardioplegia cannula is placed and additional cardioplegia is administered retrograde to the point sinus catheter.  The aortotomy  incision is reopened. There is no sign of any injury to the aorta itself down low in the aortic root and the bleeding presumably stems from beneath the valve. The aortic valve prosthesis is removed sharply with care to extract all suture material and pledgets. After the valve is been removed one can appreciate that the tissue just beneath the annulus in the aorto mitral curtain is extremely thin.  It was postulated that after decalcification in this area where there was an exceptionally large and thick area of calcium, the valve sutures must have caused some bleeding from just beneath the valve in the left ventricular outflow tract.  Reconstruction of the left ventricular outflow tract with aortic root replacement is felt to be the only viable option.  The left main and right coronary arteries are mobilized on an independent buttons of aortic tissue. The remainder of the aortic root is excised. A portion of the patient's pericardium is utilized to create a gusset of tissue to repair the area of defect just beneath the annulus. A series of interrupted 2-0 Ethibond horizontal mattress sutures are placed from deep in the left ventricular output tract to the left atrial wall posteriorly to close this area completely. The aortic annulus was now sized and will take a 21 mm mechanical prosthesis, notably slightly smaller than the supra-annular placed mechanical prosthesis.  Bentall aortic root replacement is performed. The proximal suture line is performed using interrupted 2-0 Ethibond horizontal mattress pledgeted sutures with pledgets in the supra-annular position.  A Sorin CarboMedics Valsalva mechanical valve conduit (valve size 21 mm, graft diameter 24 mm, model number CP-0-1, serial number [id number]) is secured in place with the proximal suture line. The left main and right coronary arteries are reimplanted onto the Valsalva portion of the conduit after opening circular defects in the graft with  ophthalmic cautery. The distal end of the graft is trimmed and beveled to an appropriate length and the descending aorta is beveled, after which time the distal suture line is completed with running 4-0 Prolene suture.  One final dose of warm retrograde hot shot cardioplegia is administered. The aortic cross-clamp was removed. The cross-clamp time for the second portion of the operation was 164 minutes making the total cross-clamp time for the entire operation is 238 minutes.  The heart began tediously without need for cardioversion. The patient is rewarmed to 37C temperature. The patient is resting briefly during rewarming. Subsequently the aortic and left ventricular vents are both removed.  The patient is weaned and disconnected from cardiopulmonary bypass.  The patient's rhythm at separation from bypass was AV paced.  The patient was weaned from cardioplegic bypass on low dose dopamine and milrinone infusions. Total cardiopulmonary bypass time for the operation was 328 minutes.  Followup transesophageal echocardiogram performed after separation from bypass revealed a well-seated mechanical aortic valve prosthesis that was functioning normally and without any sign of perivalvular leak.  Left ventricular function was normal and unchanged from preoperatively.  The aortic and venous cannula were removed uneventfully. Protamine was administered to reverse the anticoagulation. The mediastinum and pleural space were inspected for hemostasis and irrigated with saline solution. The mediastinum and both pleural spaces were drained using 4 chest tubes placed through separate stab incisions inferiorly.  The soft tissues anterior to the aorta were reapproximated loosely. The sternum is closed with double strength sternal wire. The soft tissues anterior to the sternum were closed in multiple layers and the skin is closed with a running subcuticular skin closure.  The patient tolerated the procedure well and is  transported to the surgical intensive care in stable condition. There are no intraoperative complications. All sponge instrument and needle counts are verified correct at completion of the operation.  The post-bypass portion of the operation was notable for stable rhythm and hemodynamics.  The patient received a total of 4 units packed red blood cells, 4 units fresh frozen plasma, and 2 packs of adult platelets during the operation.   Salvatore Decent. Cornelius Moras MD

## 2011-09-01 ENCOUNTER — Inpatient Hospital Stay (HOSPITAL_COMMUNITY): Payer: PRIVATE HEALTH INSURANCE

## 2011-09-01 DIAGNOSIS — I442 Atrioventricular block, complete: Secondary | ICD-10-CM | POA: Diagnosis not present

## 2011-09-01 LAB — PREPARE FRESH FROZEN PLASMA
Unit division: 0
Unit division: 0
Unit division: 0

## 2011-09-01 LAB — BASIC METABOLIC PANEL
BUN: 12 mg/dL (ref 6–23)
CO2: 22 mEq/L (ref 19–32)
Calcium: 7.7 mg/dL — ABNORMAL LOW (ref 8.4–10.5)
Creatinine, Ser: 0.61 mg/dL (ref 0.50–1.10)
GFR calc non Af Amer: >90 mL/min (ref >90)
Glucose, Bld: 137 mg/dL — ABNORMAL HIGH (ref 70–99)

## 2011-09-01 LAB — PREPARE PLATELET PHERESIS: Unit division: 0

## 2011-09-01 LAB — POCT I-STAT 3, ART BLOOD GAS (G3+)
Patient temperature: 36.4
pH, Arterial: 7.416 — ABNORMAL HIGH (ref 7.350–7.400)

## 2011-09-01 LAB — GLUCOSE, CAPILLARY
Glucose-Capillary: 110 mg/dL — ABNORMAL HIGH (ref 70–99)
Glucose-Capillary: 116 mg/dL — ABNORMAL HIGH (ref 70–99)
Glucose-Capillary: 124 mg/dL — ABNORMAL HIGH (ref 70–99)
Glucose-Capillary: 148 mg/dL — ABNORMAL HIGH (ref 70–99)
Glucose-Capillary: 98 mg/dL (ref 70–99)

## 2011-09-01 LAB — MAGNESIUM: Magnesium: 2.7 mg/dL — ABNORMAL HIGH (ref 1.5–2.5)

## 2011-09-01 LAB — CBC
HCT: 27.7 % — ABNORMAL LOW (ref 36.0–46.0)
Hemoglobin: 9.7 g/dL — ABNORMAL LOW (ref 12.0–15.0)
Hemoglobin: 10.5 g/dL — ABNORMAL LOW (ref 12.0–15.0)
MCH: 31 pg (ref 26.0–34.0)
MCHC: 35 g/dL (ref 30.0–36.0)
MCV: 88.5 fL (ref 78.0–100.0)
RBC: 3.13 MIL/uL — ABNORMAL LOW (ref 3.87–5.11)
RBC: 3.36 MIL/uL — ABNORMAL LOW (ref 3.87–5.11)

## 2011-09-01 LAB — POTASSIUM: Potassium: 4.4 mEq/L (ref 3.5–5.1)

## 2011-09-01 LAB — CREATININE, SERUM
Creatinine, Ser: 0.86 mg/dL (ref 0.50–1.10)
GFR calc non Af Amer: 78 mL/min — ABNORMAL LOW (ref >90)

## 2011-09-01 MED ORDER — MUPIROCIN 2 % EX OINT: 1.0000 "application " | TOPICAL_OINTMENT | Freq: Two times a day (BID) | CUTANEOUS | Status: DC

## 2011-09-01 MED ORDER — FUROSEMIDE 10 MG/ML IJ SOLN: 20.0000 mg | Freq: Four times a day (QID) | INTRAMUSCULAR | Status: DC

## 2011-09-01 MED ORDER — POTASSIUM CHLORIDE 10 MEQ/50ML IV SOLN
10.0000 meq | INTRAVENOUS | Status: AC
  Administered 2011-09-01 (×3): 10 meq via INTRAVENOUS
  Filled 2011-09-01 (×3): qty 50

## 2011-09-01 MED ORDER — FUROSEMIDE 10 MG/ML IJ SOLN
20.0000 mg | Freq: Four times a day (QID) | INTRAMUSCULAR | Status: DC
  Administered 2011-09-01: 20 mg via INTRAVENOUS
  Filled 2011-09-01: qty 2

## 2011-09-01 MED ORDER — INSULIN ASPART 100 UNIT/ML ~~LOC~~ SOLN
0.0000 [IU] | SUBCUTANEOUS | Status: DC
  Administered 2011-09-01 – 2011-09-02 (×5): 2 [IU] via SUBCUTANEOUS
  Filled 2011-09-01: qty 3

## 2011-09-01 MED ORDER — SODIUM CHLORIDE 0.9 % IV SOLN
INTRAVENOUS | Status: DC | PRN
  Filled 2011-09-01: qty 1

## 2011-09-01 MED ORDER — CHLORHEXIDINE GLUCONATE CLOTH 2 % EX PADS
6.0000 | MEDICATED_PAD | Freq: Every day | CUTANEOUS | Status: DC
  Administered 2011-09-02 – 2011-09-04 (×3): 6 via TOPICAL

## 2011-09-01 MED ORDER — INSULIN ASPART 100 UNIT/ML ~~LOC~~ SOLN
0.0000 [IU] | SUBCUTANEOUS | Status: AC
  Administered 2011-09-01 (×2): 2 [IU] via SUBCUTANEOUS
  Filled 2011-09-01: qty 3

## 2011-09-01 MED ORDER — INSULIN ASPART 100 UNIT/ML ~~LOC~~ SOLN
0.0000 [IU] | SUBCUTANEOUS | Status: DC
  Administered 2011-09-01 – 2011-09-03 (×8): 2 [IU] via SUBCUTANEOUS
  Filled 2011-09-01: qty 3

## 2011-09-01 NOTE — Anesthesia Postprocedure Evaluation (Signed)
  Anesthesia Post-op Note  Patient: Zoe Matthews [facility]  Procedure(s) Performed:  BENTALL PROCEDURE - aortic root replacement  Patient Location: ICU  Anesthesia Type: General  Level of Consciousness: awake, alert  and oriented  Airway and Oxygen Therapy: Patient Spontanous Breathing and Patient connected to nasal cannula oxygen  Post-op Pain: mild  Post-op Assessment: Post-op Vital signs reviewed and Patient's Cardiovascular Status Stable  Post-op Vital Signs: Reviewed and stable  Complications: No apparent anesthesia complications

## 2011-09-01 NOTE — Progress Notes (Signed)
[facility] BRIEF SICU PROGRESS NOTE  1 Day Post-Op  S/P Procedure(s) (LRB): BENTALL PROCEDURE (N/A)   Feels okay.  Overall stable day but BP decreased this afternoon.  Central line already out.  BP improved after IV albumin  Assessment/Plan:  Hold lasix for now.  Zoe Matthews H

## 2011-09-01 NOTE — Progress Notes (Signed)
   CARDIOTHORACIC SURGERY PROGRESS NOTE   R1 Day Post-Op Procedure(s) (LRB): BENTALL PROCEDURE (N/A)  Subjective: Feels okay. Mild soreness in chest.  Some nausea early after extubation but now resolved.  Objective: Vital signs: Filed Vitals:   09/01/11 0930  BP: 105/66  Pulse: 80  Temp: 97.9 F (36.6 C)  Resp: 4    Hemodynamics: PAP: (22-48)/(13-30) 34/21 mmHg CO:  [2.8 L/min-4.5 L/min] 3.1 L/min CI:  [1.7 L/min/m2-2.9 L/min/m2] 1.9 L/min/m2  Physical Exam:  Rhythm:   AV block  Breath sounds: clear  Heart sounds:  RRR no murmur  Incisions:  Dressings intact  Abdomen:  Soft, non-tender  Extremities:  warm   Intake/Output from previous day: 11/15 0701 - 11/16 0700 In: 11840.3 [P.O.:90; I.V.:6085.3; NWGNF:6213; IV Piggyback:1398] Out: 08657 [Urine:9260; Emesis/NG output:50; Blood:2250; Chest Tube:1200] Intake/Output this shift: Total I/O In: 424.5 [I.V.:324.5; IV Piggyback:100] Out: 115 [Urine:75; Chest Tube:40]  Lab Results:  Basename 09/01/11 0325 08/31/11 2201 08/31/11 2200  WBC 12.0* -- 10.9*  HGB 9.7* 9.2* --  HCT 27.7* 27.0* --  PLT 170 -- 142*   BMET:  Basename 09/01/11 0325 08/31/11 2201  NA 138 144  K 3.8 4.0  CL 104 105  CO2 22 --  GLUCOSE 137* 115*  BUN 12 9  CREATININE 0.61 0.50  CALCIUM 7.7* --    CBG (last 3)   Basename 09/01/11 0720 09/01/11 0520 09/01/11 0328  GLUCAP 129* 126* 128*   ABG    Component Value Date/Time   PHART 7.416* 09/01/2011 0034   HCO3 25.5* 09/01/2011 0034   TCO2 27 09/01/2011 0034   ACIDBASEDEF 1.0 08/31/2011 2227   O2SAT 100.0 09/01/2011 0034     Assessment/Plan: S/P Procedure(s) (LRB): BENTALL PROCEDURE (N/A)  Stable POD #1 Post-op 3rd degree AV block Mild expected postop acute blood loss anemia Expected postop volume excess Mild vasodilitation on low dose Neo  Wean neo as tolerated AV pace and d/c beta blocker Mobilize  Zoe Matthews,Zoe Matthews   Zoe Matthews,Zoe Matthews

## 2011-09-02 ENCOUNTER — Inpatient Hospital Stay (HOSPITAL_COMMUNITY): Payer: PRIVATE HEALTH INSURANCE

## 2011-09-02 LAB — GLUCOSE, CAPILLARY
Glucose-Capillary: 123 mg/dL — ABNORMAL HIGH (ref 70–99)
Glucose-Capillary: 124 mg/dL — ABNORMAL HIGH (ref 70–99)
Glucose-Capillary: 129 mg/dL — ABNORMAL HIGH (ref 70–99)

## 2011-09-02 LAB — BASIC METABOLIC PANEL
BUN: 27 mg/dL — ABNORMAL HIGH (ref 6–23)
CO2: 26 mEq/L (ref 19–32)
Chloride: 101 mEq/L (ref 96–112)
Glucose, Bld: 142 mg/dL — ABNORMAL HIGH (ref 70–99)
Potassium: 4.9 mEq/L (ref 3.5–5.1)
Sodium: 140 mEq/L (ref 135–145)

## 2011-09-02 LAB — CBC
HCT: 30.2 % — ABNORMAL LOW (ref 36.0–46.0)
Hemoglobin: 10.3 g/dL — ABNORMAL LOW (ref 12.0–15.0)
MCH: 31.4 pg (ref 26.0–34.0)
MCHC: 34.1 g/dL (ref 30.0–36.0)
MCV: 92.1 fL (ref 78.0–100.0)
RBC: 3.28 MIL/uL — ABNORMAL LOW (ref 3.87–5.11)

## 2011-09-02 MED ORDER — ALBUMIN HUMAN 5 % IV SOLN
12.5000 g | Freq: Once | INTRAVENOUS | Status: AC
  Administered 2011-09-02: 12.5 g via INTRAVENOUS

## 2011-09-02 MED ORDER — WARFARIN SODIUM 2 MG PO TABS
2.0000 mg | ORAL_TABLET | Freq: Every day | ORAL | Status: DC
  Administered 2011-09-02 – 2011-09-04 (×3): 2 mg via ORAL
  Filled 2011-09-02 (×4): qty 1

## 2011-09-02 MED ORDER — PHENOL 1.4 % MT LIQD
1.0000 | OROMUCOSAL | Status: DC | PRN
  Administered 2011-09-03: 1 via OROMUCOSAL
  Filled 2011-09-02: qty 177

## 2011-09-02 NOTE — Progress Notes (Signed)
Labs not drawn by VP at 0700 but RN was not made aware VP could not get sample. VP did not send someone else to try to obtain sample. VP called again at this time to send tech to draw sample (0500 CBC and BMP) x 3 calls

## 2011-09-02 NOTE — Progress Notes (Signed)
   CARDIOTHORACIC SURGERY PROGRESS NOTE   R2 Days Post-Op Procedure(s) (LRB): BENTALL PROCEDURE (N/A)  Subjective: Feels a little better.  Nausea improved but still gets a bit orthostatic with mobility.  Very mild soreness.  Objective: Vital signs: Filed Vitals:   09/02/11 1000  BP: 96/69  Pulse: 80  Temp:   Resp: 28    Hemodynamics:    Physical Exam:  Rhythm:   3rd degree AV block, AV paced  Breath sounds: clear  Heart sounds:  RRR  Incisions:  dry  Abdomen:  Soft, non-tender  Extremities:  warm   Intake/Output from previous day: 11/16 0701 - 11/17 0700 In: 1264.8 [P.O.:290; I.V.:722.8; IV Piggyback:252] Out: 1685 [Urine:1065; Chest Tube:620] Intake/Output this shift: Total I/O In: 210 [P.O.:120; I.V.:40; IV Piggyback:50] Out: 190 [Urine:40; Chest Tube:150]  Lab Results:  Basename 09/02/11 1037 09/01/11 1813  WBC 17.1* 17.8*  HGB 10.3* 10.5*  HCT 30.2* 30.7*  PLT 164 159   BMET:  Basename 09/01/11 1813 09/01/11 0325 08/31/11 2201  NA -- 138 144  K 4.4 3.8 --  CL -- 104 105  CO2 -- 22 --  GLUCOSE -- 137* 115*  BUN -- 12 9  CREATININE 0.86 0.61 --  CALCIUM -- 7.7* --    CBG (last 3)   Basename 09/02/11 0733 09/02/11 0353 09/02/11 0002  GLUCAP 130* 135* 123*   ABG    Component Value Date/Time   PHART 7.416* 09/01/2011 0034   HCO3 25.5* 09/01/2011 0034   TCO2 27 09/01/2011 0034   ACIDBASEDEF 1.0 08/31/2011 2227   O2SAT 100.0 09/01/2011 0034     Assessment/Plan: S/P Procedure(s) (LRB): BENTALL PROCEDURE (N/A)  Stable POD 2. BP still soft, unable to tolerate lasix yet Postop CHB  Will mobilize as much as possible D/C MT's but leave pleural tubes until output decreased Hold lasix for now Start coumadin slowly Consult EPS tomorrow if rhythm not recovering  OWEN,CLARENCE H

## 2011-09-03 LAB — CBC
HCT: 28.9 % — ABNORMAL LOW (ref 36.0–46.0)
MCH: 31.3 pg (ref 26.0–34.0)
MCV: 92.3 fL (ref 78.0–100.0)
RDW: 15.5 % (ref 11.5–15.5)
WBC: 12.2 10*3/uL — ABNORMAL HIGH (ref 4.0–10.5)

## 2011-09-03 LAB — GLUCOSE, CAPILLARY
Glucose-Capillary: 123 mg/dL — ABNORMAL HIGH (ref 70–99)
Glucose-Capillary: 114 mg/dL — ABNORMAL HIGH (ref 70–99)
Glucose-Capillary: 127 mg/dL — ABNORMAL HIGH (ref 70–99)

## 2011-09-03 LAB — BASIC METABOLIC PANEL
BUN: 32 mg/dL — ABNORMAL HIGH (ref 6–23)
Calcium: 8.5 mg/dL (ref 8.4–10.5)
Chloride: 101 mEq/L (ref 96–112)
Creatinine, Ser: 0.79 mg/dL (ref 0.50–1.10)
GFR calc Af Amer: >90 mL/min (ref >90)

## 2011-09-03 MED ORDER — PATIENT'S GUIDE TO USING COUMADIN BOOK
Freq: Once | Status: AC
  Filled 2011-09-03: qty 1

## 2011-09-03 MED ORDER — PATIENT'S GUIDE TO USING COUMADIN BOOK
Freq: Once | Status: AC
  Administered 2011-09-03: 15:00:00
  Filled 2011-09-03: qty 1

## 2011-09-03 MED ORDER — COUMADIN BOOK
Freq: Once | Status: AC
  Administered 2011-09-03: 15:00:00
  Filled 2011-09-03: qty 1

## 2011-09-03 MED ORDER — FUROSEMIDE 10 MG/ML IJ SOLN
20.0000 mg | Freq: Two times a day (BID) | INTRAMUSCULAR | Status: DC
  Administered 2011-09-03 – 2011-09-04 (×2): 20 mg via INTRAVENOUS
  Filled 2011-09-03 (×3): qty 2

## 2011-09-03 MED ORDER — WARFARIN VIDEO
Freq: Once | Status: AC
  Administered 2011-09-04: 20:00:00
  Filled 2011-09-03: qty 1

## 2011-09-03 NOTE — Plan of Care (Signed)
Problem: Phase I Progression Outcomes Goal: Hemodynamically stable Outcome: Progressing Variance: Arrhythmia CHB requiring pacer, ? Need for permanent pacer discussed with pt

## 2011-09-03 NOTE — Progress Notes (Signed)
   CARDIOTHORACIC SURGERY PROGRESS NOTE   R3 Days Post-Op Procedure(s) (LRB): BENTALL PROCEDURE (N/A)  Subjective: Feels better.  Voice still hoarse.  Appetite improving.  Objective: Vital signs: Filed Vitals:   09/03/11 1300  BP: 102/64  Pulse: 86  Temp:   Resp: 16    Hemodynamics:    Physical Exam:  Rhythm:   AV block  Breath sounds: clear  Heart sounds:  RRR  Incisions:  dry  Abdomen:  soft  Extremities:  Warm, swollen   Intake/Output from previous day: 11/17 0701 - 11/18 0700 In: 1363 [P.O.:1110; I.V.:203; IV Piggyback:50] Out: 1235 [Urine:525; Chest Tube:710] Intake/Output this shift: Total I/O In: 300 [P.O.:300] Out: 285 [Urine:255; Chest Tube:30]  Lab Results:  [facility] 09/03/11 0515 09/02/11 1037  WBC 12.2* 17.1*  HGB 9.8* 10.3*  HCT 28.9* 30.2*  PLT 154 164   BMET:  Basename 09/03/11 0515 09/02/11 1037  NA 136 140  K 4.0 4.9  CL 101 101  CO2 25 26  GLUCOSE 118* 142*  BUN 32* 27*  CREATININE 0.79 1.00  CALCIUM 8.5 8.4    CBG (last 3)   Basename 09/03/11 1123 09/03/11 0739 09/03/11 0529  GLUCAP 127* 114* 123*   ABG    Component Value Date/Time   PHART 7.416* 09/01/2011 0034   HCO3 25.5* 09/01/2011 0034   TCO2 27 09/01/2011 0034   ACIDBASEDEF 1.0 08/31/2011 2227   O2SAT 100.0 09/01/2011 0034   INR 1.26  Assessment/Plan: S/P Procedure(s) (LRB): BENTALL PROCEDURE (N/A)  Stable overall but still in CHB.  Likely will need perm pacer. D/c tubes Continue low dose coumadin Consult EPS Try low dose lasix  Lynna Zamorano H

## 2011-09-04 ENCOUNTER — Inpatient Hospital Stay (HOSPITAL_COMMUNITY): Payer: PRIVATE HEALTH INSURANCE

## 2011-09-04 ENCOUNTER — Encounter (HOSPITAL_COMMUNITY)
Admission: RE | Disposition: A | Discharge status: Home / Self Care | Payer: Self-pay | Source: Ambulatory Visit | Attending: Thoracic Surgery (Cardiothoracic Vascular Surgery)

## 2011-09-04 DIAGNOSIS — Z950 Presence of cardiac pacemaker: Secondary | ICD-10-CM | POA: Diagnosis not present

## 2011-09-04 DIAGNOSIS — I442 Atrioventricular block, complete: Secondary | ICD-10-CM

## 2011-09-04 HISTORY — DX: Atrioventricular block, complete: I44.2

## 2011-09-04 HISTORY — PX: PACEMAKER INSERTION: SHX728

## 2011-09-04 HISTORY — PX: PERMANENT PACEMAKER INSERTION: SHX5480

## 2011-09-04 LAB — TYPE AND SCREEN
Antibody Screen: NEGATIVE
Unit division: 0
Unit division: 0
Unit division: 0

## 2011-09-04 LAB — CBC
HCT: 27.6 % — ABNORMAL LOW (ref 36.0–46.0)
MCV: 93.2 fL (ref 78.0–100.0)
RBC: 2.96 MIL/uL — ABNORMAL LOW (ref 3.87–5.11)
RDW: 15 % (ref 11.5–15.5)
WBC: 9.8 10*3/uL (ref 4.0–10.5)

## 2011-09-04 LAB — BASIC METABOLIC PANEL
CO2: 29 mEq/L (ref 19–32)
Chloride: 100 mEq/L (ref 96–112)
Creatinine, Ser: 0.66 mg/dL (ref 0.50–1.10)
GFR calc Af Amer: >90 mL/min (ref >90)
Potassium: 3.1 mEq/L — ABNORMAL LOW (ref 3.5–5.1)
Sodium: 138 mEq/L (ref 135–145)

## 2011-09-04 LAB — PROTIME-INR
INR: 1.45 (ref 0.00–1.49)
Prothrombin Time: 17.9 seconds — ABNORMAL HIGH (ref 11.6–15.2)

## 2011-09-04 SURGERY — PERMANENT PACEMAKER INSERTION
Anesthesia: LOCAL

## 2011-09-04 MED ORDER — FENTANYL CITRATE 0.05 MG/ML IJ SOLN
INTRAMUSCULAR | Status: AC
  Filled 2011-09-04: qty 2

## 2011-09-04 MED ORDER — SODIUM CHLORIDE 0.9 % IJ SOLN
3.0000 mL | Freq: Two times a day (BID) | INTRAMUSCULAR | Status: DC
  Administered 2011-09-04 – 2011-09-05 (×2): 3 mL via INTRAVENOUS

## 2011-09-04 MED ORDER — SODIUM CHLORIDE 0.9 % IV SOLN
INTRAVENOUS | Status: DC
  Administered 2011-09-04: 50 mL via INTRAVENOUS

## 2011-09-04 MED ORDER — SODIUM CHLORIDE 0.9 % IJ SOLN: 3.0000 mL | INTRAMUSCULAR | Status: DC | PRN

## 2011-09-04 MED ORDER — SODIUM CHLORIDE 0.9 % IR SOLN
80.0000 mg | Status: DC
  Filled 2011-09-04: qty 2

## 2011-09-04 MED ORDER — FUROSEMIDE 10 MG/ML IJ SOLN
20.0000 mg | Freq: Two times a day (BID) | INTRAMUSCULAR | Status: AC
  Administered 2011-09-04 – 2011-09-05 (×2): 20 mg via INTRAVENOUS

## 2011-09-04 MED ORDER — POTASSIUM CHLORIDE CRYS ER 20 MEQ PO TBCR
40.0000 meq | EXTENDED_RELEASE_TABLET | Freq: Once | ORAL | Status: AC
  Administered 2011-09-04: 40 meq via ORAL
  Filled 2011-09-04: qty 2

## 2011-09-04 MED ORDER — CEFAZOLIN SODIUM 1-5 GM-% IV SOLN
1.0000 g | INTRAVENOUS | Status: DC
  Filled 2011-09-04: qty 50

## 2011-09-04 MED ORDER — CEFAZOLIN SODIUM 1-5 GM-% IV SOLN
1.0000 g | Freq: Four times a day (QID) | INTRAVENOUS | Status: AC
  Administered 2011-09-04 – 2011-09-05 (×3): 1 g via INTRAVENOUS
  Filled 2011-09-04 (×3): qty 50

## 2011-09-04 MED ORDER — SODIUM CHLORIDE 0.45 % IV SOLN
INTRAVENOUS | Status: DC
  Administered 2011-09-04: 50 mL via INTRAVENOUS

## 2011-09-04 MED ORDER — MIDAZOLAM HCL 2 MG/2ML IJ SOLN
INTRAMUSCULAR | Status: AC
  Filled 2011-09-04: qty 2

## 2011-09-04 NOTE — Consult Note (Signed)
ELECTROPHYSIOLOGY CONSULT NOTE  Patient ID: TENE GATO MRN: [id number], DOB/AGE: [birth year] 50 y.o.  Admit date: 08/31/2011 Date of Consult: 09-04-11  Primary Physician: Freddy Finner, MD Primary Cardiologist: Viann Fish, MD  Reason for Consultation: heart block s/p Bentall procedure  HPI: Mrs. Howells is a 50 year old female with a history of aortic stenosis.  She is s/p Bentall procedure this admission on 08-31-11.  Post-op, she has done well.  She has had persistent complete heart block post operatively and remains pacemaker dependant at this time via epicardial wires.  She has otherwise made good recovery.  She reports stable chest wall pain.  She denies fevers, chills, shortness of breath, or other concerns.   Past Medical History  Diagnosis Date  . AS (aortic stenosis)   . Bicuspid aortic valve   . PONV (postoperative nausea and vomiting)     also remarks drop in BP  . Heart murmur   . Shortness of breath     /w exertion      Surgical History:  Past Surgical History  Procedure Date  . Cardiac catheterization 08/17/11    normal coronaries, critical AS  . Cyst left breast removed   . Breast surgery     benign- cyst- L - 1983     Prescriptions prior to admission  Medication Sig Dispense Refill  . Cholecalciferol (VITAMIN D3) 2000 UNITS capsule Take 2,000 Units by mouth daily.        . Multiple Vitamin (MULTIVITAMIN) tablet Take 1 tablet by mouth daily.        . vitamin C (ASCORBIC ACID) 500 MG tablet Take 500 mg by mouth daily.          Inpatient Medications:    . acetaminophen (TYLENOL) oral liquid 160 mg/5 mL  975 mg Per Tube Q6H   Or  . acetaminophen  1,000 mg Oral Q6H  . aspirin EC  325 mg Oral Daily   Or  . aspirin  324 mg Per Tube Daily  . bisacodyl  10 mg Oral Daily   Or  . bisacodyl  10 mg Rectal Daily  . Chlorhexidine Gluconate Cloth  6 each Topical Daily  . coumadin book   Does not apply Once  . docusate sodium  200 mg Oral Daily  .  furosemide  20 mg Intravenous Q12H  . magnesium sulfate infusion  4 g Intravenous Once  . mupirocin cream   Topical BID  . pantoprazole  40 mg Oral Q1200  . patient's guide to using coumadin book   Does not apply Once  . patient's guide to using coumadin book   Does not apply Once  . potassium chloride  40 mEq Oral Once  . sodium chloride  3 mL Intravenous Q12H  . warfarin  2 mg Oral q1800  . warfarin   Does not apply Once  . DISCONTD: aminocaproic acid (AMICAR) IVPB  5 g Intravenous Once  . DISCONTD: furosemide  20 mg Intravenous Q12H  . DISCONTD: insulin aspart  0-24 Units Subcutaneous Q4H    Allergies:  Allergies  Allergen Reactions  . Codeine Nausea And Vomiting  . Sulfa Antibiotics Hives    History   Social History  . Marital Status: Married    Spouse Name: N/A    Number of Children: N/A  . Years of Education: N/A   Occupational History  . OPERATIONS MANAGER [employer]     Social History Main Topics  . Smoking status: Never Smoker   .  Smokeless tobacco: Never Used  . Alcohol Use: Yes     rare use- socially   . Drug Use: No  . Sexually Active: Not on file     novoring   Other Topics Concern  . Not on file   Social History Narrative   Divorced, Research scientist (life sciences) for [employer] outletAttends [employer]     Family History  Problem Relation Age of Onset  . Diabetes Father   . Hyperlipidemia Father   . Hypertension Father   . Anesthesia problems Neg Hx   . Hypotension Neg Hx   . Malignant hyperthermia Neg Hx   . Pseudochol deficiency Neg Hx     ROS- all systems reviewed and negative except as per HPI above  Physical Exam: Filed Vitals:   09/04/11 0900 09/04/11 1000 09/04/11 1100 09/04/11 1128  BP: 107/69 118/71 114/62   Pulse: 92  94   Temp:    98.6 F (37 C)  TempSrc:    Oral  Resp: 23 23 20    Height:      Weight:      SpO2: 99%  99%    GEN- The patient is well appearing, alert and oriented x 3 today.     Head- normocephalic, atraumatic Eyes-  Sclera clear, conjunctiva pink Ears- hearing intact Oropharynx- clear Neck- supple, no JVP, s/p L neck CVL removal, site OK Lymph- no cervical lymphadenopathy Lungs- Clear to ausculation bilaterally, normal work of breathing Heart- Regular rate and rhythm,   GI- soft, NT, ND, + BS Extremities- no clubbing, cyanosis, + dependant edema MS- no significant deformity or atrophy Skin- no rash or lesion Psych- euthymic mood, full affect Neuro- strength and sensation are intact  Tele- AV paced, underlying is complete heart block with occasional escape beats  Labs:   Lab Results  Component Value Date   WBC 9.8 09/04/2011   HGB 9.4* 09/04/2011   HCT 27.6* 09/04/2011   MCV 93.2 09/04/2011   PLT 175 09/04/2011    Lab 09/04/11 0531 08/28/11 1401  NA 138 --  K 3.1* --  CL 100 --  CO2 29 --  BUN 21 --  CREATININE 0.66 --  CALCIUM 8.2* --  PROT -- 6.8  BILITOT -- 0.3  ALKPHOS -- 59  ALT -- 11  AST -- 16  GLUCOSE 104* --     Lab Results  Component Value Date   DDIMER 0.95* 08/31/2011     Radiology/Studies:   Dg Chest Portable 1 View In Am  09/01/2011  *RADIOLOGY REPORT*  Clinical Data: Postop open heart surgery, follow-up  PORTABLE CHEST - 1 VIEW  Comparison: Portable chest x-ray of 08/31/2011  Findings: The endotracheal tube has been removed.  Bilateral chest tubes are present and no pneumothorax is seen.  There is little change in aeration with minimal basilar atelectasis present.  A Swan-Ganz catheter remains with the tip in the main pulmonary artery.  IMPRESSION:  1.  Endotracheal tube removed.  Little change in aeration. 2.  Bilateral chest tubes remain.  No pneumothorax.  Original Report Authenticated By: Juline Patch, M.D.   Assessment and plan: The patient has symptomatic complete heart block s/p aortic valve surgery.  Her AV block persists 4 days now postoperatively.  I would therefore recommend pacemaker implantation at this  time.  Risks, benefits, alternatives to pacemaker implantation were discussed in detail with the patient today. The patient understands that the risks include but are not limited to bleeding, infection, pneumothorax, perforation, tamponade, vascular damage,  renal failure, MI, stroke, death,  and lead dislodgement and wishes to proceed. We will therefore proceed with pacemaker implantation at this time.  Hillis Range, MD  09/04/2011 2:06 PM

## 2011-09-04 NOTE — Progress Notes (Signed)
   CARDIOTHORACIC SURGERY PROGRESS NOTE   R4 Days Post-Op Procedure(s) (LRB): BENTALL PROCEDURE (N/A)  Subjective: Feels pretty well. Slept well last night. No SOB. Appetite improving. + BM this morning.  Objective: Vital signs: Filed Vitals:   09/04/11 0725  BP:   Pulse:   Temp: 98.4 F (36.9 C)  Resp:     Hemodynamics:    Physical Exam:  Rhythm:   AV block  Breath sounds: clear  Heart sounds:  RRR  Incisions:  dry  Abdomen:  soft  Extremities:  warm   Intake/Output from previous day: 11/18 0701 - 11/19 0700 In: 1160 [P.O.:1140; I.V.:20] Out: 1645 [Urine:1615; Chest Tube:30] Intake/Output this shift:    Lab Results:  Basename 09/04/11 0531 09/03/11 0515  WBC 9.8 12.2*  HGB 9.4* 9.8*  HCT 27.6* 28.9*  PLT 175 154   BMET:  Basename 09/04/11 0531 09/03/11 0515  NA 138 136  K 3.1* 4.0  CL 100 101  CO2 29 25  GLUCOSE 104* 118*  BUN 21 32*  CREATININE 0.66 0.79  CALCIUM 8.2* 8.5    CBG (last 3)   Basename 09/03/11 1123 09/03/11 0739 09/03/11 0529  GLUCAP 127* 114* 123*   ABG    Component Value Date/Time   PHART 7.416* 09/01/2011 0034   HCO3 25.5* 09/01/2011 0034   TCO2 27 09/01/2011 0034   ACIDBASEDEF 1.0 08/31/2011 2227   O2SAT 100.0 09/01/2011 0034   INR 1.45  Assessment/Plan: S/P Procedure(s) (LRB): BENTALL PROCEDURE (N/A)  Overall doing well POD4 but still in complete heart block.  Needs perm pacer.  Continue coumadin at current dose for now.  Andriy Sherk H

## 2011-09-04 NOTE — Progress Notes (Signed)
Patient examined and record reviewed.Hemodynamics stable,labs satisfactory.Patient had stable day.Continue current care. Patient had  Perm pacer today, site w/o hematoma  Zoe Matthews,Zoe Matthews 09/04/2011

## 2011-09-04 NOTE — Brief Op Note (Signed)
08/31/2011 - 09/04/2011  4:04 PM  PATIENT:  Frederich Balding Sturdy  49 y.o. female  PRE-OPERATIVE DIAGNOSIS:  Complete heart block POST-OPERATIVE DIAGNOSIS:  Complete heart block  PROCEDURE:  Procedure(s): PERMANENT PACEMAKER INSERTION  SURGEON:  Surgeon(s): Gardiner Rhyme, MD    ANESTHESIA:   none  EBL:  Total I/O In: 350.8 [P.O.:240; I.V.:110.8] Out: 400 [Urine:400]  BLOOD ADMINISTERED:none  DRAINS: none   LOCAL MEDICATIONS USED:  LIDOCAINE 8 CC  SPECIMEN:  No Specimen  DISPOSITION OF SPECIMEN:  N/A  COUNTS:  YES  TOURNIQUET:  * No tourniquets in log *  DICTATION: .Note written in EPIC  PLAN OF CARE: return to 2300  PATIENT DISPOSITION:  PACU - hemodynamically stable.   Delay start of Pharmacological VTE agent (>24hrs) due to surgical blood loss or risk of bleeding:  {YES/NO/NOT APPLICABLE:20182

## 2011-09-04 NOTE — Op Note (Signed)
SURGEON:  Hillis Range, MD     PREPROCEDURE DIAGNOSIS:  Complete heart block    POSTPROCEDURE DIAGNOSIS:   Complete heart block     PROCEDURES:   1. Pacemaker implantation.     INTRODUCTION: Zoe Matthews is a 50 y.o. female  with a history of complete heart block who presents today for pacemaker implantation.  She is s/p aortic valve replacement.  Se has had persistent heart block requiring epicardial pacing.  The patient therefore presents today for pacemaker implantation.     DESCRIPTION OF PROCEDURE:  Informed written consent was obtained, and the patient was brought to the electrophysiology lab in a fasting state.  The patient was adequately sedated with intravenous versed and fentanyl as outlined in the nursing report.  The patients left chest was prepped and draped in the usual sterile fashion by the EP lab staff. The skin overlying the left deltopectoral region was infiltrated with lidocaine for local analgesia.  A 4-cm incision was made over the left deltopectoral region.  A left subcutaneous pacemaker pocket was fashioned using a combination of sharp and blunt dissection. Electrocautery was required to assure hemostasis.    RA/RV Lead Placement: The left axillary vein was cannulated with fluoroscopic guidance.  No contrast was required for this endeavor.  Through the left axillary vein, a Medtronic model [id number] (serial number [id number]) right atrial lead and a Medtronic model 5076- 58 (serial number [id number]) right ventricular lead were advanced with fluoroscopic visualization into the right atrial appendage and right ventricular apex positions respectively.  Initial atrial lead P- waves measured 1.9 mV with impedance of 524 ohms and a threshold of 1.5 V at 0.5 msec.  Right ventricular lead R-waves measured 9.4 mV with an impedance of 826 ohms and a threshold of 0.4 V at 0.5 msec.  Both leads were secured to the pectoralis fascia using #2 silk over the suture sleeves.   Device  Placement: The leads were then connected to a Medtronic Adapta L model ADDRL 1 (serial number [id number]) pacemaker.  The pocket was irrigated with copious gentamicin solution.  The pacemaker was then placed into the pocket.  The pocket was then closed in 2 layers with 2.0 Vicryl suture for the subcutaneous and subcuticular layers.  Steri-  Strips and a sterile dressing were then applied.  There were no early apparent complications.     CONCLUSIONS:   1. Successful implantation of a Medtronic Adapta L dual-chamber pacemaker for complete heart block  2. No early apparent complications.           Hillis Range, MD 09/04/2011 4:06 PM

## 2011-09-05 ENCOUNTER — Inpatient Hospital Stay (HOSPITAL_COMMUNITY): Payer: PRIVATE HEALTH INSURANCE

## 2011-09-05 ENCOUNTER — Encounter (HOSPITAL_COMMUNITY): Payer: Self-pay

## 2011-09-05 LAB — BASIC METABOLIC PANEL
BUN: 20 mg/dL (ref 6–23)
CO2: 29 mEq/L (ref 19–32)
Calcium: 8.5 mg/dL (ref 8.4–10.5)
Chloride: 101 mEq/L (ref 96–112)
Creatinine, Ser: 0.58 mg/dL (ref 0.50–1.10)
GFR calc Af Amer: >90 mL/min (ref >90)
GFR calc non Af Amer: >90 mL/min (ref >90)
Glucose, Bld: 107 mg/dL — ABNORMAL HIGH (ref 70–99)
Potassium: 3.2 mEq/L — ABNORMAL LOW (ref 3.5–5.1)
Sodium: 139 mEq/L (ref 135–145)

## 2011-09-05 LAB — CBC
HCT: 28.1 % — ABNORMAL LOW (ref 36.0–46.0)
Hemoglobin: 9.2 g/dL — ABNORMAL LOW (ref 12.0–15.0)
MCH: 30.6 pg (ref 26.0–34.0)
MCHC: 32.7 g/dL (ref 30.0–36.0)
RDW: 14.9 % (ref 11.5–15.5)

## 2011-09-05 LAB — PROTIME-INR: Prothrombin Time: 17.3 seconds — ABNORMAL HIGH (ref 11.6–15.2)

## 2011-09-05 MED ORDER — SODIUM CHLORIDE 0.9 % IJ SOLN: 3.0000 mL | INTRAMUSCULAR | Status: DC | PRN

## 2011-09-05 MED ORDER — METOPROLOL TARTRATE 12.5 MG HALF TABLET
12.5000 mg | ORAL_TABLET | Freq: Two times a day (BID) | ORAL | Status: DC
  Administered 2011-09-05 – 2011-09-06 (×3): 12.5 mg via ORAL
  Filled 2011-09-05 (×4): qty 1

## 2011-09-05 MED ORDER — ONDANSETRON HCL 4 MG/2ML IJ SOLN: 4.0000 mg | Freq: Four times a day (QID) | INTRAMUSCULAR | Status: DC | PRN

## 2011-09-05 MED ORDER — ACETAMINOPHEN 325 MG PO TABS
650.0000 mg | ORAL_TABLET | Freq: Four times a day (QID) | ORAL | Status: DC | PRN
  Administered 2011-09-06: 650 mg via ORAL
  Filled 2011-09-05: qty 2

## 2011-09-05 MED ORDER — MOVING RIGHT ALONG BOOK
Freq: Once | Status: AC
  Administered 2011-09-05: 13:00:00
  Filled 2011-09-05: qty 1

## 2011-09-05 MED ORDER — OXYCODONE HCL 5 MG PO TABS: 5.0000 mg | ORAL_TABLET | ORAL | Status: DC | PRN

## 2011-09-05 MED ORDER — BISACODYL 10 MG RE SUPP: 10.0000 mg | Freq: Every day | RECTAL | Status: DC | PRN

## 2011-09-05 MED ORDER — PANTOPRAZOLE SODIUM 40 MG PO TBEC
40.0000 mg | DELAYED_RELEASE_TABLET | Freq: Every day | ORAL | Status: DC
  Administered 2011-09-06: 40 mg via ORAL
  Filled 2011-09-05: qty 1

## 2011-09-05 MED ORDER — POTASSIUM CHLORIDE CRYS ER 20 MEQ PO TBCR
40.0000 meq | EXTENDED_RELEASE_TABLET | Freq: Two times a day (BID) | ORAL | Status: DC
  Administered 2011-09-05 – 2011-09-06 (×3): 40 meq via ORAL
  Filled 2011-09-05 (×5): qty 2

## 2011-09-05 MED ORDER — SODIUM CHLORIDE 0.9 % IV SOLN: 250.0000 mL | INTRAVENOUS | Status: DC

## 2011-09-05 MED ORDER — WARFARIN SODIUM 5 MG PO TABS
5.0000 mg | ORAL_TABLET | Freq: Every day | ORAL | Status: DC
  Administered 2011-09-05: 5 mg via ORAL
  Filled 2011-09-05 (×2): qty 1

## 2011-09-05 MED ORDER — TRAMADOL HCL 50 MG PO TABS: 50.0000 mg | ORAL_TABLET | ORAL | Status: DC | PRN

## 2011-09-05 MED ORDER — DOCUSATE SODIUM 100 MG PO CAPS
200.0000 mg | ORAL_CAPSULE | Freq: Every day | ORAL | Status: DC
  Administered 2011-09-05 – 2011-09-06 (×2): 200 mg via ORAL
  Filled 2011-09-05 (×2): qty 2

## 2011-09-05 MED ORDER — FUROSEMIDE 20 MG PO TABS
20.0000 mg | ORAL_TABLET | Freq: Every day | ORAL | Status: AC
  Administered 2011-09-05 – 2011-09-06 (×2): 20 mg via ORAL
  Filled 2011-09-05 (×2): qty 1

## 2011-09-05 MED ORDER — SODIUM CHLORIDE 0.9 % IJ SOLN
3.0000 mL | Freq: Two times a day (BID) | INTRAMUSCULAR | Status: DC
  Administered 2011-09-05 – 2011-09-06 (×3): 3 mL via INTRAVENOUS

## 2011-09-05 MED ORDER — ONDANSETRON HCL 4 MG PO TABS: 4.0000 mg | ORAL_TABLET | Freq: Four times a day (QID) | ORAL | Status: DC | PRN

## 2011-09-05 MED ORDER — POVIDONE-IODINE 10 % EX SOLN
1.0000 "application " | Freq: Two times a day (BID) | CUTANEOUS | Status: DC
  Administered 2011-09-05 – 2011-09-06 (×3): 1 via TOPICAL
  Filled 2011-09-05: qty 15

## 2011-09-05 MED ORDER — BISACODYL 5 MG PO TBEC: 10.0000 mg | DELAYED_RELEASE_TABLET | Freq: Every day | ORAL | Status: DC | PRN

## 2011-09-05 MED FILL — Heparin Sodium (Porcine) Inj 1000 Unit/ML: INTRAMUSCULAR | Qty: 90 | Status: AC

## 2011-09-05 MED FILL — Sodium Chloride Irrigation Soln 0.9%: Qty: 3000 | Status: AC

## 2011-09-05 MED FILL — Electrolyte-R (PH 7.4) Solution: INTRAVENOUS | Qty: 8000 | Status: AC

## 2011-09-05 MED FILL — Sodium Chloride IV Soln 0.9%: INTRAVENOUS | Qty: 1000 | Status: AC

## 2011-09-05 NOTE — Progress Notes (Signed)
   CARDIOTHORACIC SURGERY PROGRESS NOTE   R1 Day Post-Op Procedure(s) (LRB): PERMANENT PACEMAKER INSERTION (N/A)  Subjective: Feels well.  Looks good.  Objective: Vital signs: Filed Vitals:   09/05/11 0800  BP: 110/71  Pulse: 85  Temp:   Resp: 0    Hemodynamics:    Physical Exam:  Rhythm:   Sinus w/ V-pacing  Breath sounds: clear  Heart sounds:  RRR  Incisions:  Clean and dry  Abdomen:  soft  Extremities:  warm   Intake/Output from previous day: 11/19 0701 - 11/20 0700 In: 920.8 [P.O.:600; I.V.:170.8; IV Piggyback:150] Out: 2500 [Urine:2500] Intake/Output this shift:    Lab Results:  Basename 09/05/11 0438 09/04/11 0531  WBC 7.8 9.8  HGB 9.2* 9.4*  HCT 28.1* 27.6*  PLT 195 175   BMET:  Basename 09/05/11 0438 09/04/11 0531  NA 139 138  K 3.2* 3.1*  CL 101 100  CO2 29 29  GLUCOSE 107* 104*  BUN 20 21  CREATININE 0.58 0.66  CALCIUM 8.5 8.2*    CBG (last 3)   Basename 09/03/11 1123 09/03/11 0739 09/03/11 0529  GLUCAP 127* 114* 123*   ABG    Component Value Date/Time   PHART 7.416* 09/01/2011 0034   HCO3 25.5* 09/01/2011 0034   TCO2 27 09/01/2011 0034   ACIDBASEDEF 1.0 08/31/2011 2227   O2SAT 100.0 09/01/2011 0034   INR 1.39   Assessment/Plan: S/P Procedure(s) (LRB): PERMANENT PACEMAKER INSERTION (N/A)  Doing well POD5 Bentall and POD1 perm pacer for CHB Transfer 2000 Increase coumadin Supplement K+ Possible d/c home 1-2 days  OWEN,CLARENCE H

## 2011-09-05 NOTE — Progress Notes (Signed)
Patient Name: Zoe Matthews Date of Encounter: 09-05-11   SUBJECTIVE:Pt feels well s/p PPM implant (MDT) for heart block post Bentall procedure.  TELEMETRY: Reviewed telemetry pt in sinus rhythm with ventricular pacing: Filed Vitals:   09/05/11 0600 09/05/11 0700 09/05/11 0735 09/05/11 0800  BP: 110/74 107/75  110/71  Pulse: 88 83  85  Temp:   97.8 F (36.6 C)   TempSrc:   Oral   Resp: 19 14  0  Height:      Weight:      SpO2: 97% 97%  97%    Intake/Output Summary (Last 24 hours) at 09/05/11 1001 Last data filed at 09/05/11 0800  Gross per 24 hour  Intake 1040.83 ml  Output   2400 ml  Net -1359.17 ml    LABS: Basic Metabolic Panel:  Basename 09/05/11 0438 09/04/11 0531  NA 139 138  K 3.2* 3.1*  CL 101 100  CO2 29 29  GLUCOSE 107* 104*  BUN 20 21  CREATININE 0.58 0.66  CALCIUM 8.5 8.2*  MG -- --  PHOS -- --   CBC:  Basename 09/05/11 0438 09/04/11 0531  WBC 7.8 9.8  NEUTROABS -- --  HGB 9.2* 9.4*  HCT 28.1* 27.6*  MCV 93.4 93.2  PLT 195 175   Radiology/Studies:  Dg Chest 2 View- leads in stable position  09/05/2011  *RADIOLOGY REPORT*  Clinical Data: Pacemaker inserted, check wire positions.  CHEST - 2 VIEW  Comparison: 09/04/2011  Findings: Interval left chest wall pacemaker insertion with dual leads, projecting over the right atrium and right ventricle.  The leads appear to be continuous.  Status post median sternotomy and aortic valve replacement.  Cardiomegaly. Mild hyperinflation without focal consolidation.  Small right and trace left pleural effusions.  No pneumothorax.  No acute osseous abnormality.  IMPRESSION: Interval left chest wall pacemaker with dual leads, projecting over the right atrium and right ventricle.  Original Report Authenticated By: Waneta Martins, M.D.    Physical Exam: Filed Vitals:   09/05/11 0600 09/05/11 0700 09/05/11 0735 09/05/11 0800  BP: 110/74 107/75  110/71  Pulse: 88 83  85  Temp:   97.8 F (36.6 C)   TempSrc:    Oral   Resp: 19 14  0  Height:      Weight:      SpO2: 97% 97%  97%    GEN- The patient is ill appearing, alert and oriented x 3 today.   Head- normocephalic, atraumatic Eyes-  Sclera clear, conjunctiva pink Ears- hearing intact Oropharynx- clear Lungs- Clear to ausculation bilaterally, normal work of breathing Heart- Regular rate and rhythm, mechanical S2 GI- soft, NT, ND, + BS Extremities- no clubbing, cyanosis, + dependant edema Left chest without hematoma or ecchymosis.  Tegaderm dressing left in place.  DEVICE INTERROGATION: Device interrogated by industry.  Lead values including impedence, sensing, threshold within normal values.    Assessment and Plan CHB- doing well s/p PPM Routine wound care Follow-up with [facility] in 10 days  Will as needed while here.   Hillis Range, MD 09/05/2011 10:12 AM

## 2011-09-05 NOTE — Progress Notes (Signed)
EP wires D/C'd intact per protocol.  Patient tolerated well and instructed to remain in bed for one hour. Baird Lyons 2:01 PM 09/05/11

## 2011-09-06 ENCOUNTER — Encounter (HOSPITAL_COMMUNITY): Payer: Self-pay | Admitting: General Practice

## 2011-09-06 ENCOUNTER — Encounter: Payer: Self-pay | Admitting: *Deleted

## 2011-09-06 LAB — BASIC METABOLIC PANEL
CO2: 27 mEq/L (ref 19–32)
Calcium: 8.9 mg/dL (ref 8.4–10.5)
Chloride: 103 mEq/L (ref 96–112)
Creatinine, Ser: 0.5 mg/dL (ref 0.50–1.10)
Glucose, Bld: 96 mg/dL (ref 70–99)

## 2011-09-06 LAB — PROTIME-INR
INR: 1.97 — ABNORMAL HIGH (ref 0.00–1.49)
Prothrombin Time: 22.8 seconds — ABNORMAL HIGH (ref 11.6–15.2)

## 2011-09-06 MED ORDER — WARFARIN SODIUM 2.5 MG PO TABS
2.5000 mg | ORAL_TABLET | Freq: Every day | ORAL | Status: DC
  Administered 2011-09-06: 2.5 mg via ORAL
  Filled 2011-09-06: qty 1

## 2011-09-06 MED ORDER — TRAMADOL HCL 50 MG PO TABS: 50.0000 mg | ORAL_TABLET | Freq: Four times a day (QID) | ORAL | Status: AC | PRN

## 2011-09-06 MED ORDER — ASPIRIN EC 325 MG PO TBEC: 325.0000 mg | DELAYED_RELEASE_TABLET | Freq: Every day | ORAL | Status: AC

## 2011-09-06 MED ORDER — WARFARIN SODIUM 5 MG PO TABS: 5.0000 mg | ORAL_TABLET | Freq: Every day | ORAL | Status: AC

## 2011-09-06 MED ORDER — METOPROLOL TARTRATE 12.5 MG HALF TABLET: 12.5000 mg | ORAL_TABLET | Freq: Two times a day (BID) | ORAL | Status: DC

## 2011-09-06 MED FILL — Dexmedetomidine HCl IV Soln 200 MCG/2ML: INTRAVENOUS | Qty: 2 | Status: AC

## 2011-09-06 MED FILL — Potassium Chloride Inj 2 mEq/ML: INTRAVENOUS | Qty: 40 | Status: AC

## 2011-09-06 MED FILL — Magnesium Sulfate Inj 50%: INTRAMUSCULAR | Qty: 10 | Status: AC

## 2011-09-06 NOTE — Discharge Summary (Signed)
Physician Discharge Summary  Patient ID: Zoe Matthews MRN: [id number] DOB/AGE: 11/29/1960 50 y.o.  Admit date: 08/31/2011 Discharge date: 09/06/2011  Admission Diagnoses:  1.Bicuspid aortic valve with Severe AS   Discharge Diagnoses:   1.Bicuspid aortic valve with Severe AS 2.  Complete heart block, post-surgical     Consults: Dr. Johney Frame (EPS)  Procedure (s): 1. Bentall aortic root replacement in ( using a [employer] [facility] mechanical valve conduit, size 21 mm) and reimplantation of left main and right coronary arteries by Dr. Cornelius Moras 08/31/2011. 2. Pacemaker implantation by Dr. Johney Frame on 09/04/2011  History of Presenting Illness: This is a 50 year old Caucasian female with a known history of bicuspid aortic valve and aortic stenosis. The patient was found to have a heart murmur at birth and has been followed carefully every since. The patient has had routine followup echocardiograms with Dr. Donnie Aho. Over the past 3 months, the patient developed exertional shortness of breath. An echocardiogram done on 08/09/2011 revealed the progression to severe aortic stenosis. 8 velocity across the aortic valve was measured 5.5 mm meters per second. Peak and mean transvalvular gradients were estimated to be 120 and 70 mm of mercury respectively patient had concentric left ventricular hypertrophy with normal left ventricular systolic function. There is also trace mitral regurg and mild tricuspid regurgitation. She then underwent a  right and left heart catheterization on 08/17/2011. This confirmed the presence of severe stenosis. Mean gradient across the valve measured 57 mmHg, a peak gradient of 92 mmHg, an aortic valve area was estimated to be 0.51 cm , and pulmonary artery pressure of 30/11. She was found to have normal coronary anatomy with no significant coronary artery disease. Aortic root and thoracic aorta were mildly dilated. Ascending thoracic and abdominal aorta and iliac vessels were free of  any significant aortoiliac disease. The patient was initially seen in consultation the office by Dr. Cornelius Moras on 08/28/2011 for consideration of aortic valve replacement. A long discussion was had with the patient regarding potential risks complications benefits of the surgery. She strongly desired injury to to be performed via minimally invasive approach. Dr. Cornelius Moras discussed that a transesophageal echocardiogram would be done prior to her undergoing surgery. If the aortic root size appeared to be relatively small then we were going to proceed with a conventional sternotomy and not a minimally invasive approach. Also, the patient also strongly expressed her desire to have mechanical and not tissue valve. She understood and accepted  all associated risks related to long-term anticoagulation. It should be noted that pre operative doppler carotid US showed no significant internal carotid artery stenosis bilaterally.  Patient was admitted on 08/28/2011 in order to undergo aortic valve replacement.  Brief Hospital Course: Patient was extubated the day of surgery without difficulty. Patient remained afebrile and hemodynamic is stable. He was found to have third degree AV block block postoperatively. She was weaned off low-dose Neo-Synephrine.  Her Swan-Ganz, A-line, chest tubes, and Foley were removed fairly early in her postoperative course. She was found to have acute blood loss anemia. H&H are gone as low as 9.4 and 26.8. Her last H&H was 9.2 and 20.1 respectively. Coumadin was started and her PT/INR were monitored daily. Unfortunately, the patient remained in complete heart block. An EPS consult was obtained obtained with Dr. Johney Frame.  A permanent pacemaker insertion was then done by Dr. Johney Frame on 09/04/2011. Patient was then started a low-dose Lopressor. She had mild volume overloaded and was diuresed accordingly. She was felt surgically stable for transfer  from the intensive care unit to 2000 for further convalescence.   She continued to make steady progress and remained in sinus rhythm (with V pacing). She was seen and evaluated by Dr. Cornelius Moras 09/06/2011. She was felt surgically stable for discharge home. It should be medications are then tolerating a diet has had a bowel movement.   Filed Vitals:   09/06/11 0457  BP: 93/63  Pulse: 85  Temp: 97.9 F (36.6 C)  Resp: 19     Discharge Exam: Blood pressure 93/63, pulse 85, temperature 97.9 F (36.6 C), temperature source Oral, resp. rate 19, height 5\' 1"  (1.549 m), weight 156 lb 4.9 oz (70.9 kg), last menstrual period 08/15/2011, SpO2 97.00%.  Physical Exam:  Rhythm: Sinus w/ V-pacing  Breath sounds: clear  Heart sounds: RRR  Incisions: Clean and dry  Abdomen: soft  Extremities: warm    Discharge Condition: Stable  Significant Diagnostic Studies: radiology: last CXR 11/20:Findings: Interval left chest wall pacemaker insertion with dual  leads, projecting over the right atrium and right ventricle. The  leads appear to be continuous. Status post median sternotomy and  aortic valve replacement. Cardiomegaly. Mild hyperinflation  without focal consolidation. Small right and trace left pleural  effusions. No pneumothorax. No acute osseous abnormality.  IMPRESSION:  Interval left chest wall pacemaker with dual leads, projecting over  the right atrium and right ventricle.  Latest Lab Studies: CBC 11/20: WBC 7.8 RBC 3.01 HGB 9.2 HCT 28.1 Platelets 195  BMET 11/21: Sodium 136 Potassium 4.2 Chloride 103 BUN 14 Cr 0.58 Calcium 8.9  PT/INR 11/21 22.81.97      Discharge Orders    Future Appointments: Provider: Department: Dept Phone: Center:   10/02/2011 2:00 PM Purcell Nails, MD [facility] [phone] [facility]      Discharge Medications: Current Discharge Medication List    START taking these medications   Details  aspirin EC 325 MG tablet Take 1 tablet (325 mg total) by mouth daily. Qty: 30 tablet, Refills: 0    metoprolol tartrate  (LOPRESSOR) 12.5 mg TABS Take 0.5 tablets (12.5 mg total) by mouth 2 (two) times daily. Qty: 30 tablet, Refills: 1    traMADol (ULTRAM) 50 MG tablet Take 1-2 tablets (50-100 mg total) by mouth every 6 (six) hours as needed for pain. Maximum dose= 8 tablets per day Qty: 40 tablet, Refills: 0    warfarin (COUMADIN) 5 MG tablet Take 1 tablet (5 mg total) by mouth daily at 6 PM. Patient to take (Warfarin) Coumadin 5 mg po every evening or as directed by Dr. York Spaniel office   Rob Bunting: 30 tablet, Refills: 1      CONTINUE these medications which have NOT CHANGED   Details  Cholecalciferol (VITAMIN D3) 2000 UNITS capsule Take 2,000 Units by mouth daily.      Multiple Vitamin (MULTIVITAMIN) tablet Take 1 tablet by mouth daily.      vitamin C (ASCORBIC ACID) 500 MG tablet Take 500 mg by mouth daily.          Follow Up Appointments: Follow-up Information    Follow up with Purcell Nails, MD on 10/02/2011. (PA/LAT CXR to be done 12/17 at 1:15 pm;Appointment is 12/17 at 2 pm)    Contact information:   [address] [place] [address] [phone]       Follow up with Darden Palmer, MD. Make an appointment on 09/11/2011. (Call for appointment time to have PT/INR drawn on 11/26; Call for a follow up appoinment  for 2 weeks.)    Contact information:   [address] [place] [address] [phone]       Make an appointment with Hillis Range, MD. (Call for an appointment )    Contact information:   [address], [address] [place] [address] [phone]          Signed: Ardelle Balls, PA 09/06/2011, 10:07 AM

## 2011-09-06 NOTE — Progress Notes (Signed)
   CARDIOTHORACIC SURGERY PROGRESS NOTE  2 Days Post-Op  S/P Procedure(s) (LRB): PERMANENT PACEMAKER INSERTION (N/A)  Subjective: Looks great and feels well. Wants to go home  Objective: Vital signs in last 24 hours: Temp:  [97.9 F (36.6 C)-98.7 F (37.1 C)] 97.9 F (36.6 C) (11/21 0457) Pulse Rate:  [85-99] 85  (11/21 0457) Cardiac Rhythm:  [-] Ventricular paced;Other (Comment) (11/21 0809) Resp:  [18-19] 19  (11/21 0457) BP: (93-129)/(63-79) 93/63 mmHg (11/21 0457) SpO2:  [97 %] 97 % (11/21 0457) FiO2 (%):  [0 %] 0 % (11/20 1214) Weight:  [70.9 kg (156 lb 4.9 oz)] 156 lb 4.9 oz (70.9 kg) (11/21 0457)  Physical Exam:  Rhythm:   Sinus w/ V-pacing  Breath sounds: clear  Heart sounds:  RRR  Incisions:  Clean and dry  Abdomen:  soft  Extremities:  warm   Intake/Output from previous day: 11/20 0701 - 11/21 0700 In: 1200 [P.O.:1200] Out: 1275 [Urine:1275] Intake/Output this shift: Total I/O In: 120 [P.O.:120] Out: -   Lab Results:  [facility] 09/05/11 0438 09/04/11 0531  WBC 7.8 9.8  HGB 9.2* 9.4*  HCT 28.1* 27.6*  PLT 195 175   BMET:  Basename 09/06/11 0630 09/05/11 0438  NA 136 139  K 4.2 3.2*  CL 103 101  CO2 27 29  GLUCOSE 96 107*  BUN 14 20  CREATININE 0.50 0.58  CALCIUM 8.9 8.5    CBG (last 3)   Basename 09/03/11 1123  GLUCAP 127*   PT/INR:   Basename 09/05/11 0438  LABPROT 17.3*  INR 1.39    CXR:  N/A  Assessment/Plan: S/P Procedure(s) (LRB): PERMANENT PACEMAKER INSERTION (N/A)   Doing well POD #6 Bentall POD #2 perm pacer  Ready for d/c home  Labs drawn but no results yet for INR today  I think she can go home today once we know her INR and establish coumadin dose.    Check follow up INR at Dr. York Spaniel office on Monday  Patient instructions discussed  Purcell Nails 09/06/2011

## 2011-09-06 NOTE — Progress Notes (Signed)
   CARE MANAGEMENT NOTE 09/06/2011  Patient:  Zoe Matthews, Zoe Matthews   Account Number:  [id number]  Date Initiated:  09/05/2011  Documentation initiated by:  [facility]  Subjective/Objective Assessment:   Post op cardiac surgery on 08-31-11 - post op pace maker on 09-04-11     Action/Plan:   PTA, PT INDEPENDENT, LIVES WITH HUSBAND.   Anticipated DC Date:  09/08/2011   Anticipated DC Plan:  HOME W HOME HEALTH SERVICES      DC Planning Services  CM consult      Choice offered to / List presented to:             Status of service:  Completed, signed off Medicare Important Message given?   (If response is "NO", the following Medicare IM given date fields will be blank) Date Medicare IM given:   Date Additional Medicare IM given:    Discharge Disposition:  HOME/SELF CARE  Per UR Regulation:  Reviewed for med. necessity/level of care/duration of stay  Comments:  09/06/11 Zoe Bunning,RN,BSN 1600 PT DISCHARGED TO HOME TODAY WITH HUSBAND.  NO HOME HEALTH OR DME NEEDED.  09-05-11 11:45am Zoe Matthews, RNBSN [phone] UR Completed.

## 2011-09-06 NOTE — Progress Notes (Signed)
CARDIAC REHAB PHASE I   PRE:  Rate/Rhythm: 96 Paced  BP:  Supine:   Sitting: 122/66  Standing:    SaO2: 95 RA  MODE:  Ambulation: 550 ft   POST:  Rate/Rhythem: 99 Paced  BP:  Supine:   Sitting: 126/70 70  Standing:    SaO2: 100 RA  Patient ambulated today times 1 assist. Tolerated well with ambulation. Patient had no complaints. VSS. BROWN, Jahnia Hewes L

## 2011-09-11 ENCOUNTER — Encounter: Payer: Self-pay | Admitting: Cardiology

## 2011-09-11 DIAGNOSIS — Z7901 Long term (current) use of anticoagulants: Secondary | ICD-10-CM | POA: Insufficient documentation

## 2011-09-14 ENCOUNTER — Ambulatory Visit (INDEPENDENT_AMBULATORY_CARE_PROVIDER_SITE_OTHER): Payer: PRIVATE HEALTH INSURANCE | Admitting: *Deleted

## 2011-09-14 ENCOUNTER — Encounter: Payer: Self-pay | Admitting: Internal Medicine

## 2011-09-14 DIAGNOSIS — R000 Tachycardia, unspecified: Secondary | ICD-10-CM

## 2011-09-14 DIAGNOSIS — I442 Atrioventricular block, complete: Secondary | ICD-10-CM

## 2011-09-14 LAB — PACEMAKER DEVICE OBSERVATION
AL THRESHOLD: 0.75 V
BATTERY VOLTAGE: 2.79 V
RV LEAD THRESHOLD: 1 V

## 2011-09-14 LAB — CBC WITH DIFFERENTIAL/PLATELET
Basophils Relative: 3.3 % — ABNORMAL HIGH (ref 0.0–3.0)
Eosinophils Relative: 5.2 % — ABNORMAL HIGH (ref 0.0–5.0)
HCT: 28.9 % — ABNORMAL LOW (ref 36.0–46.0)
Hemoglobin: 9.6 g/dL — ABNORMAL LOW (ref 12.0–15.0)
Lymphs Abs: 1.6 10*3/uL (ref 0.7–4.0)
MCV: 96.7 fl (ref 78.0–100.0)
Monocytes Absolute: 0.8 10*3/uL (ref 0.1–1.0)
Neutro Abs: 5.2 10*3/uL (ref 1.4–7.7)
Neutrophils Relative %: 62.7 % (ref 43.0–77.0)
RBC: 2.98 Mil/uL — ABNORMAL LOW (ref 3.87–5.11)
WBC: 8.3 10*3/uL (ref 4.5–10.5)

## 2011-09-14 LAB — T4, FREE: Free T4: 0.89 ng/dL (ref 0.60–1.60)

## 2011-09-14 LAB — BASIC METABOLIC PANEL
Chloride: 108 mEq/L (ref 96–112)
Potassium: 4.7 mEq/L (ref 3.5–5.1)
Sodium: 140 mEq/L (ref 135–145)

## 2011-09-14 NOTE — Patient Instructions (Signed)
Increase metoprolol to 25 mg twice daily.

## 2011-09-28 ENCOUNTER — Other Ambulatory Visit: Payer: Self-pay | Admitting: Thoracic Surgery (Cardiothoracic Vascular Surgery)

## 2011-09-28 DIAGNOSIS — I359 Nonrheumatic aortic valve disorder, unspecified: Secondary | ICD-10-CM

## 2011-10-02 ENCOUNTER — Encounter: Payer: Self-pay | Admitting: Thoracic Surgery (Cardiothoracic Vascular Surgery)

## 2011-10-02 ENCOUNTER — Ambulatory Visit (INDEPENDENT_AMBULATORY_CARE_PROVIDER_SITE_OTHER): Payer: Self-pay | Admitting: Thoracic Surgery (Cardiothoracic Vascular Surgery)

## 2011-10-02 ENCOUNTER — Ambulatory Visit
Admission: RE | Admit: 2011-10-02 | Discharge: 2011-10-02 | Disposition: A | Discharge status: Home / Self Care | Payer: PRIVATE HEALTH INSURANCE | Source: Ambulatory Visit | Attending: Thoracic Surgery (Cardiothoracic Vascular Surgery) | Admitting: Thoracic Surgery (Cardiothoracic Vascular Surgery)

## 2011-10-02 VITALS — BP 134/87 | HR 100 | Resp 18 | Ht 61.0 in | Wt 150.0 lb

## 2011-10-02 DIAGNOSIS — Z9889 Other specified postprocedural states: Secondary | ICD-10-CM

## 2011-10-02 DIAGNOSIS — I359 Nonrheumatic aortic valve disorder, unspecified: Secondary | ICD-10-CM

## 2011-10-02 DIAGNOSIS — Z952 Presence of prosthetic heart valve: Secondary | ICD-10-CM

## 2011-10-02 DIAGNOSIS — Z954 Presence of other heart-valve replacement: Secondary | ICD-10-CM

## 2011-10-02 DIAGNOSIS — I350 Nonrheumatic aortic (valve) stenosis: Secondary | ICD-10-CM

## 2011-10-02 NOTE — Patient Instructions (Signed)
The patient has been reminded to continue to avoid any heavy lifting or strenuous use of arms or shoulders for at least a total of three months from the time of surgery. The patient has been instructed that they may return driving an automobile as long as they are no longer requiring oral narcotic pain relievers during the daytime.  They have been advised to start driving short distances during the daylight and gradually increase from there as they feel comfortable.  

## 2011-10-02 NOTE — Progress Notes (Signed)
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 Postoperatively the patient also had hoarseness of her voice presumably related to trauma to the larynx at the time of intubation for surgery. Since hospital discharge the patient has done quite well. She continues to have her prothrombin time checked and monitored. She states that her voice has been slow to improve but it is gradually getting better. She's not having any problems with aspiration or coughing when she eats or drinks. She denies any shortness of breath and she notes that her breathing is already better than it was prior to surgery. She's had minimal soreness in her chest and she is no longer taking any sort of pain relievers. She's not had any tachypalpitations or dizzy spells. The remainder of her review of systems is unremarkable.   Current Outpatient Prescriptions  Medication Sig Dispense Refill  . metoprolol tartrate (LOPRESSOR) 12.5 mg TABS Take 25 mg by mouth 2 (two) times daily.        . Multiple Vitamin (MULTIVITAMIN) tablet Take 1 tablet by mouth daily.        Marland Kitchen warfarin (COUMADIN) 5 MG tablet Take 1 tablet (5 mg total) by mouth daily at 6 PM. Patient to take (Warfarin) Coumadin 5 mg po every evening or as directed by Dr. York Spaniel office    30 tablet  1      Physical Exam:   BP 134/87  Pulse 100  Resp 18  Ht 5\' 1"  (1.549 m)  Wt 68.04 kg (150 lb)  BMI 28.34 kg/m2  SpO2 98%  LMP 09/16/2011  HEENT:  Unremarkable  Chest:   Sternal incision is healing nicely and the sternum is stable to palpation. Breath sounds are clear to  auscultation and symmetrical bilaterally  CV:   Regular rate and rhythm with mechanical heart sounds  Abdomen:  Soft and nontender  Extremities:  Warm and well-perfused, there is no lower extremity edema  Diagnostic Tests:  Chest x-ray performed today demonstrates clear lung fields bilaterally with trivial bilateral pleural effusions. All of the sternal wires appear intact. The pacemaker leads appear to be in normal position. No significant abnormalities are noted.   Impression:  Excellent progress following recent Bentall aortic root replacement with a mechanical valve conduit.  Postoperative hoarseness of voice that is improving, presumably related to laryngeal trauma.  Plan:  I've encouraged patient to continue to gradually increase her physical activity as tolerated but to remain careful to avoid any sort of heavy lifting or strenuous use of her arms or shoulders for at least another 2 months. I've encouraged her to get started in the cardiac rehabilitation program. I think it is reasonable for her to resume driving an automobile. I've offered to consider referring her for an appointment with an otolaryngologist if her voice does not continue to improve, but this point she feels it is getting better does not want to pursue it any further.   Salvatore Decent. Cornelius Moras, MD 10/02/2011 2:46 PM

## 2011-12-04 ENCOUNTER — Encounter: Payer: Self-pay | Admitting: Internal Medicine

## 2011-12-04 ENCOUNTER — Ambulatory Visit (INDEPENDENT_AMBULATORY_CARE_PROVIDER_SITE_OTHER): Payer: PRIVATE HEALTH INSURANCE | Admitting: Internal Medicine

## 2011-12-04 DIAGNOSIS — I442 Atrioventricular block, complete: Secondary | ICD-10-CM

## 2011-12-04 DIAGNOSIS — Z950 Presence of cardiac pacemaker: Secondary | ICD-10-CM

## 2011-12-04 LAB — PACEMAKER DEVICE OBSERVATION
AL IMPEDENCE PM: 496 Ohm
AL THRESHOLD: 0.625 V
RV LEAD IMPEDENCE PM: 561 Ohm
RV LEAD THRESHOLD: 0.5 V

## 2011-12-04 NOTE — Patient Instructions (Addendum)
Your physician recommends that you schedule a follow-up appointment as needed  

## 2011-12-04 NOTE — Progress Notes (Signed)
PCP:  Zoe Finner, MD, MD Primary Cardiologist:  [name]  The patient presents today for routine electrophysiology followup.  Since having her PPM implanted, the patient reports doing very well.  Her exercise tolerance is gradually improving.  Today, she denies symptoms of palpitations, chest pain, shortness of breath, orthopnea, PND, lower extremity edema, dizziness, presyncope, syncope, or neurologic sequela.  The patient feels that she is tolerating medications without difficulties and is otherwise without complaint today.   Past Medical History  Diagnosis Date  . AS (aortic stenosis)   . Bicuspid aortic valve   . Complete heart block 09/04/2011    sp PPM (MDT) by Dr Johney Frame  . Long-term (current) use of anticoagulants    Past Surgical History  Procedure Date  . Cardiac catheterization 08/17/11    normal coronaries, critical AS  . Cyst left breast removed   . Breast surgery     benign- cyst- L - 1983  . Pacemaker insertion 09/04/2011    [employer]  Dr. Johney Frame  . Aortic valve replacement 08/31/2011    [employer] mechanical valve conduit   . Bentall procedure 08/31/2011    Procedure: BENTALL PROCEDURE;  Surgeon: Purcell Nails, MD;  Location: [facility] OR;  Service: Open Heart Surgery;  Laterality: N/A;  aortic root replacement    Current Outpatient Prescriptions  Medication Sig Dispense Refill  . aspirin 325 MG tablet Take 325 mg by mouth daily.      . metoprolol tartrate (LOPRESSOR) 12.5 mg TABS Take 25 mg by mouth 2 (two) times daily.        . Multiple Vitamin (MULTIVITAMIN) tablet Take 1 tablet by mouth daily.        Marland Kitchen warfarin (COUMADIN) 5 MG tablet Take 1 tablet (5 mg total) by mouth daily at 6 PM. Patient to take (Warfarin) Coumadin 5 mg po every evening or as directed by Dr. York Spaniel office    30 tablet  1    Allergies  Allergen Reactions  . Codeine Nausea And Vomiting  . Sulfa Antibiotics Hives    History   Social History  . Marital Status: Married    Spouse  Name: N/A    Number of Children: N/A  . Years of Education: N/A   Occupational History  . OPERATIONS MANAGER [employer]     Social History Main Topics  . Smoking status: Never Smoker   . Smokeless tobacco: Never Used  . Alcohol Use: Yes     rare use- socially   . Drug Use: No  . Sexually Active: Yes     novoring   Other Topics Concern  . Not on file   Social History Narrative   Divorced, Research scientist (life sciences) for [employer] outletAttends [employer]    Family History  Problem Relation Age of Onset  . Diabetes Father   . Hyperlipidemia Father   . Hypertension Father   . Anesthesia problems Neg Hx   . Hypotension Neg Hx   . Malignant hyperthermia Neg Hx   . Pseudochol deficiency Neg Hx     Physical Exam: Filed Vitals:   12/04/11 1357  BP: 114/60  Pulse: 87  Weight: 147 lb 6.4 oz (66.86 kg)    GEN- The patient is well appearing, alert and oriented x 3 today.   Head- normocephalic, atraumatic Eyes-  Sclera clear, conjunctiva pink Ears- hearing intact Oropharynx- clear Neck- supple, no JVP Lymph- no cervical lymphadenopathy Lungs- Clear to ausculation bilaterally, normal work of breathing Chest- pacemaker pocket is  well healed Heart- Regular rate and rhythm, mechanical S2 GI- soft, NT, ND, + BS Extremities- no clubbing, cyanosis, or edema  Pacemaker interrogation- reviewed in detail today,  See PACEART report  Assessment and Plan:

## 2011-12-04 NOTE — Assessment & Plan Note (Signed)
Normal pacemaker function See Arita Miss Art report No changes today  She will follow-up with [name] for long term pacemaker care.  I am happy to see her as needed.

## 2012-01-01 ENCOUNTER — Ambulatory Visit (INDEPENDENT_AMBULATORY_CARE_PROVIDER_SITE_OTHER): Payer: PRIVATE HEALTH INSURANCE | Admitting: Thoracic Surgery (Cardiothoracic Vascular Surgery)

## 2012-01-01 ENCOUNTER — Encounter: Payer: Self-pay | Admitting: Thoracic Surgery (Cardiothoracic Vascular Surgery)

## 2012-01-01 VITALS — BP 103/76 | HR 82 | Resp 18 | Ht 61.0 in | Wt 150.0 lb

## 2012-01-01 DIAGNOSIS — I359 Nonrheumatic aortic valve disorder, unspecified: Secondary | ICD-10-CM

## 2012-01-01 DIAGNOSIS — Z952 Presence of prosthetic heart valve: Secondary | ICD-10-CM | POA: Insufficient documentation

## 2012-01-01 DIAGNOSIS — Z9889 Other specified postprocedural states: Secondary | ICD-10-CM

## 2012-01-01 DIAGNOSIS — Z954 Presence of other heart-valve replacement: Secondary | ICD-10-CM

## 2012-01-01 NOTE — Progress Notes (Signed)
                   [address]            [place] [address]          [phone]     CARDIOTHORACIC SURGERY OFFICE NOTE  Referring Provider is Viann Fish, MD PCP is Freddy Finner, MD, MD   HPI:  Patient returns for further followup status post Bentall aortic root replacement on 08/31/2011. She was last seen here in this office on 10/02/2011. Since then she has done very well. Her voice should return somewhat suddenly on Christmas Day. She has otherwise done exceptionally well. She reports that she is back to normal physical activity without any limitations whatsoever. She has excellent exercise tolerance she has not had any significant problems with Coumadin therapy. She has no complaints.   Current Outpatient Prescriptions  Medication Sig Dispense Refill  . aspirin 325 MG tablet Take 325 mg by mouth daily.      . metoprolol tartrate (LOPRESSOR) 12.5 mg TABS Take 25 mg by mouth 2 (two) times daily.        . Multiple Vitamin (MULTIVITAMIN) tablet Take 1 tablet by mouth daily.        Marland Kitchen warfarin (COUMADIN) 5 MG tablet Take 1 tablet (5 mg total) by mouth daily at 6 PM. Patient to take (Warfarin) Coumadin 5 mg po every evening or as directed by Dr. York Spaniel office    30 tablet  1      Physical Exam:   BP 103/76  Pulse 82  Resp 18  Ht 5\' 1"  (1.549 m)  Wt 150 lb (68.04 kg)  BMI 28.34 kg/m2  SpO2 100%  General:  Well-appearing  Chest:   Clear to auscultation  CV:   Regular rate and rhythm with mechanical sounds  Incisions:  Completely healed  Abdomen:  Soft and nontender  Extremities:  Warm and well-perfused  Diagnostic Tests:  n/a   Impression:  Patient is doing very well following Bentall aortic root replacement.  Plan:  In the future the patient will call and return to see [place] as needed. All her questions been addressed.   Salvatore Decent. Cornelius Moras, MD 01/01/2012 11:17 AM

## 2013-04-04 IMAGING — CR DG CHEST 2V
2 series · 2 of 2 positions shown · non-contrast
Comparison: None.

CLINICAL DATA: Aortic valve disorder.  Pre catheterization.

CHEST - 2 VIEW

[w chest pa]
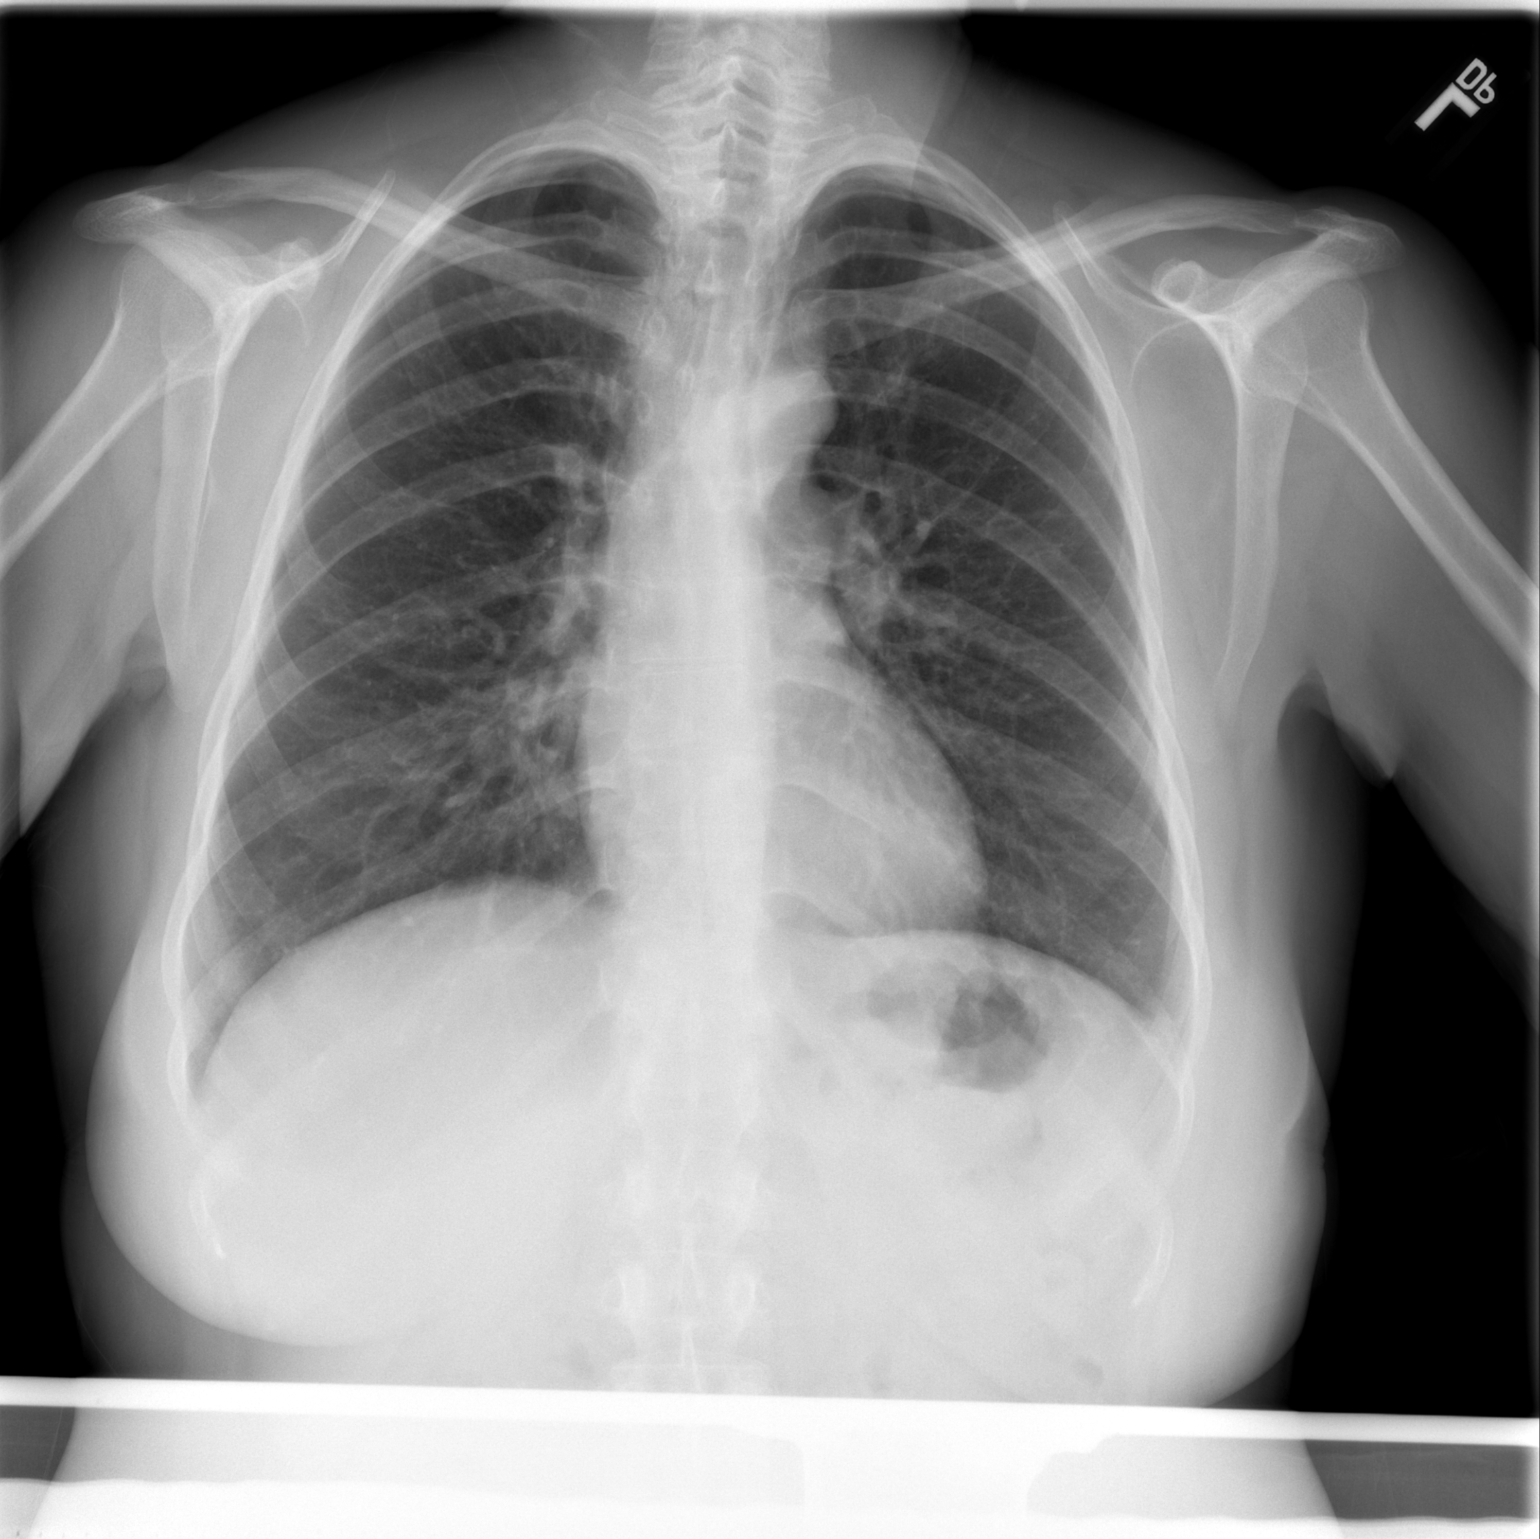

[w chest lat]
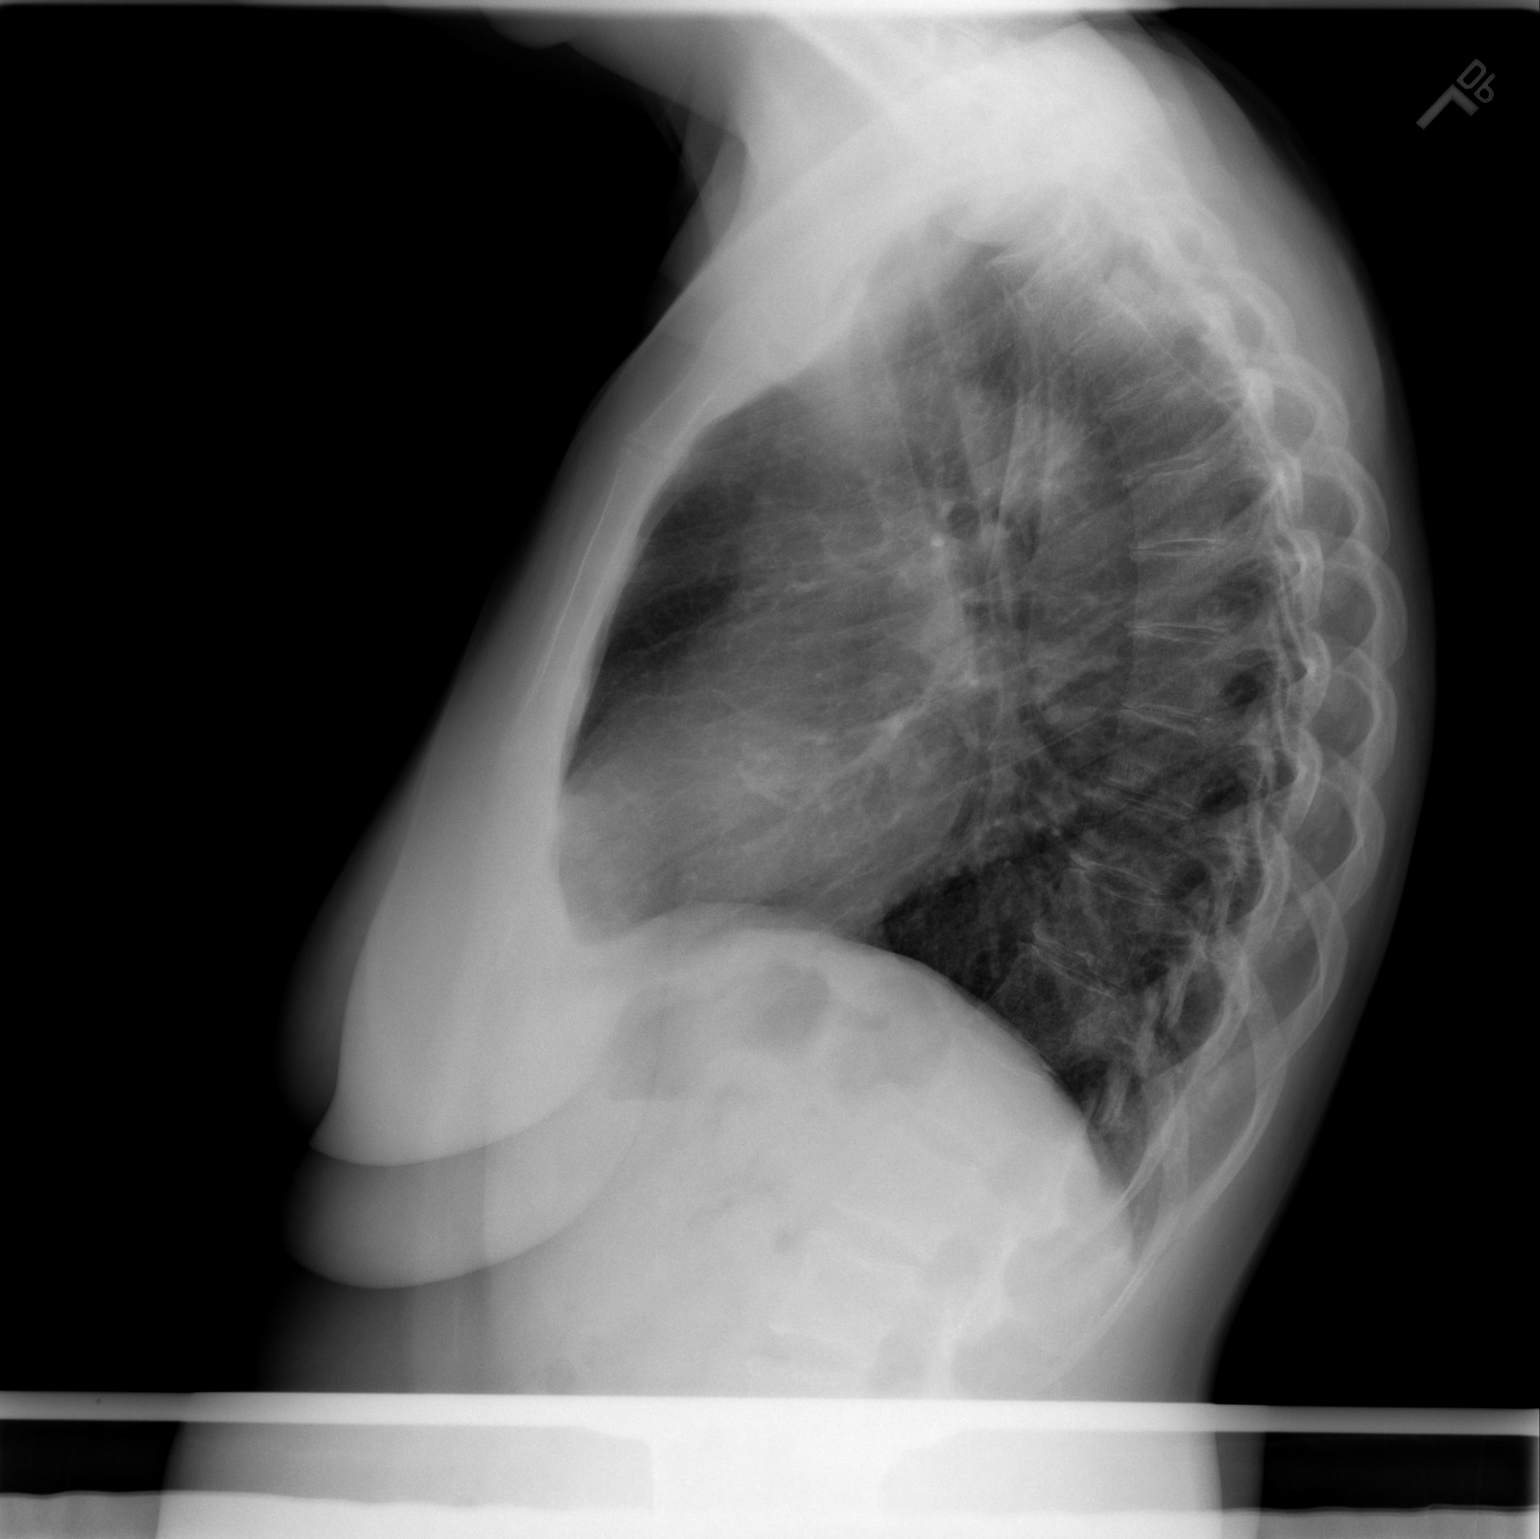

[2 of 2 positions shown; findings below may reference images not displayed]

FINDINGS: The heart size and mediastinal contours are normal.  No
significant dilatation of the aorta is identified.  There are
possible calcifications in the region of the aortic valve on the
lateral view.  The lungs are clear.  There is no pleural effusion
or pneumothorax.  No acute osseous findings are identified.
IMPRESSION: No active cardiopulmonary process.  Aortic valvular calcifications
are difficult to exclude.

## 2013-04-17 IMAGING — CR DG CHEST 2V
2 series · 2 of 2 positions shown · non-contrast
Comparison: 08/15/2011

CLINICAL DATA: Preop aortic  valve replacement.  Short of breath

CHEST - 2 VIEW

[view not recorded (1 of 2)]
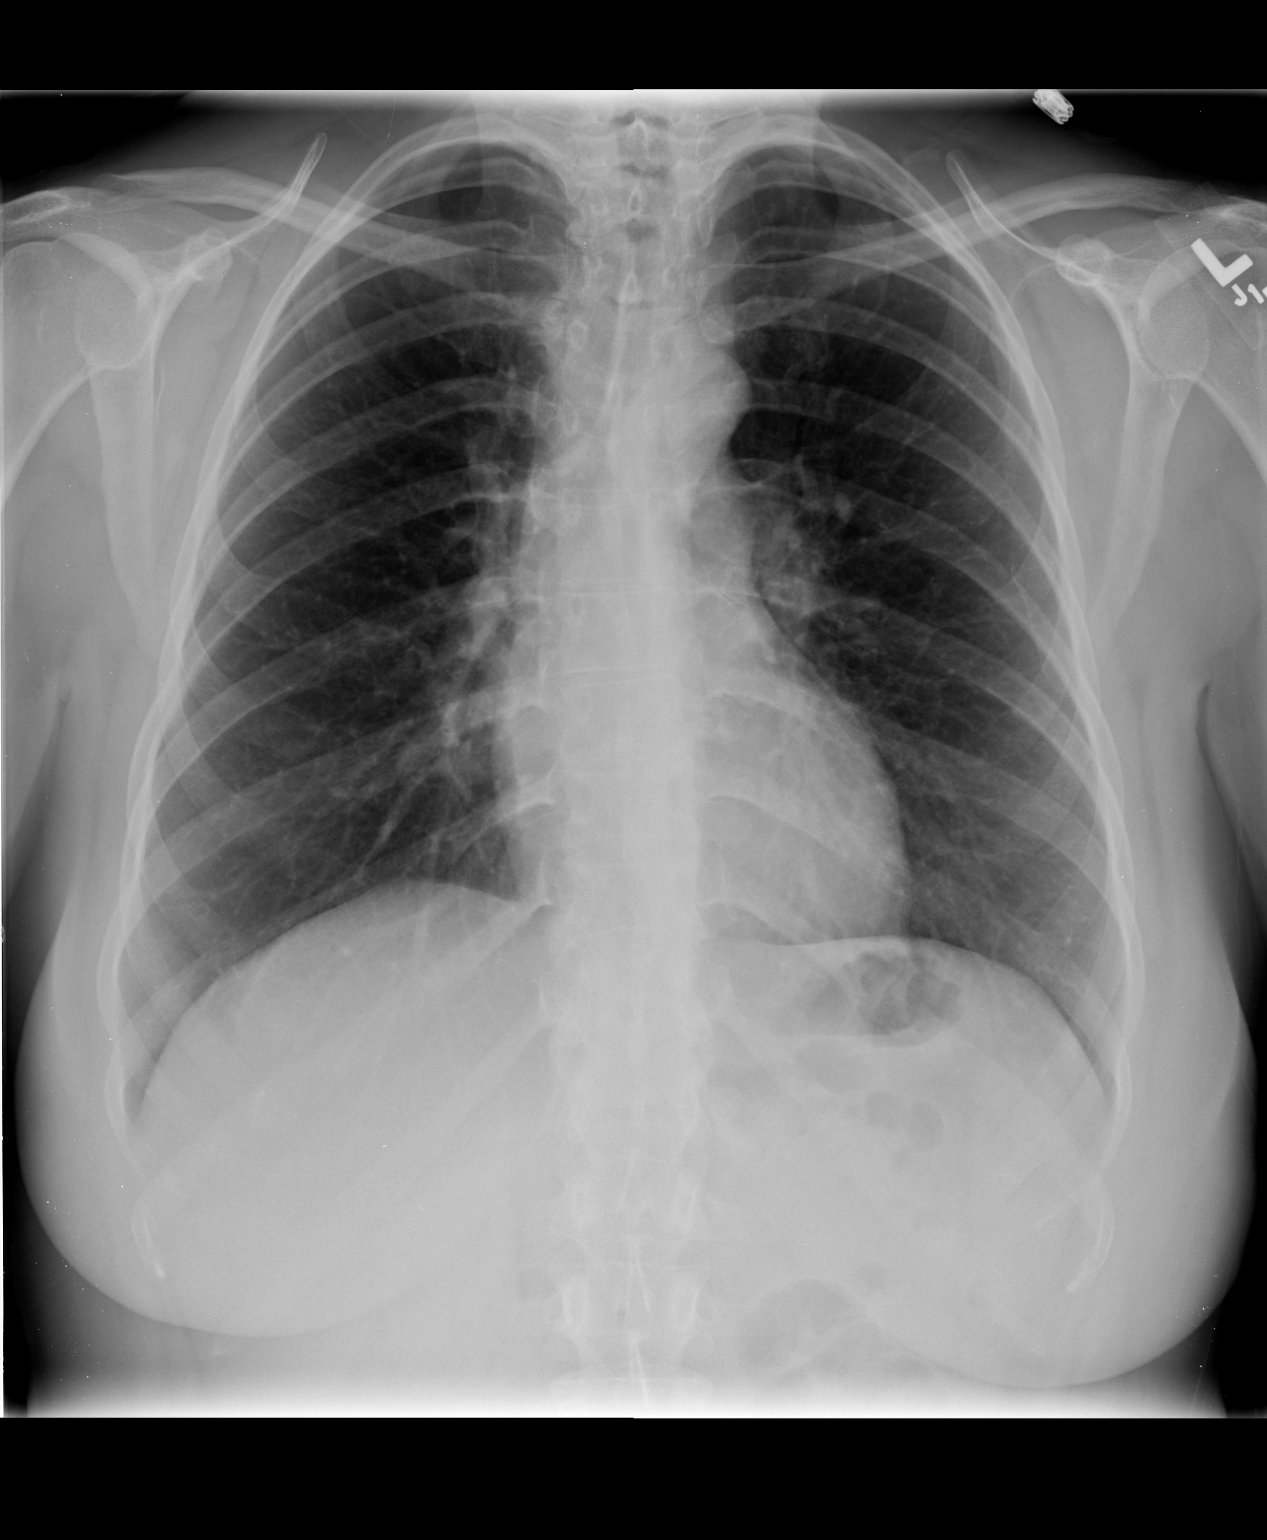

[view not recorded (2 of 2)]
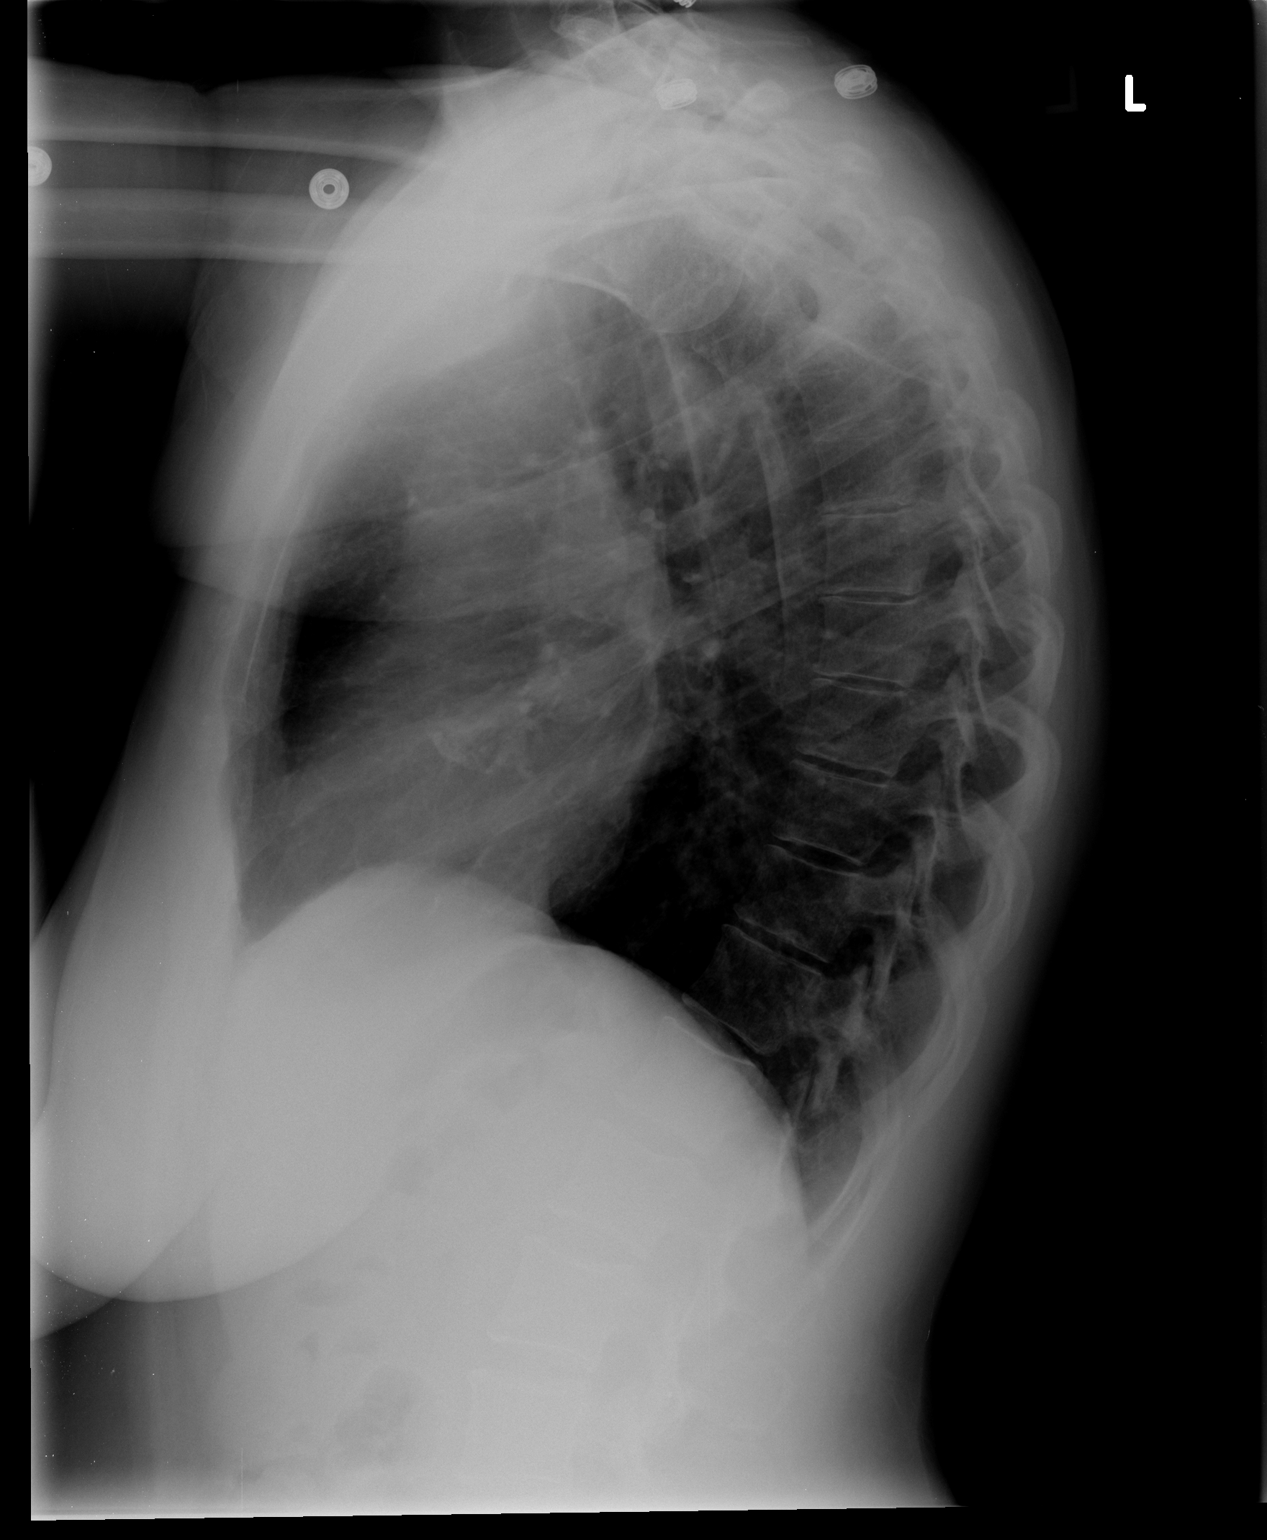

[2 of 2 positions shown; findings below may reference images not displayed]

FINDINGS: Heart size is normal.  Vascularity is normal.  Lungs are
clear without infiltrate or effusion. Aortic valve calcification is
present.
IMPRESSION: No active cardiopulmonary disease.

## 2014-09-23 ENCOUNTER — Encounter (HOSPITAL_COMMUNITY): Payer: Self-pay | Admitting: Internal Medicine

## 2015-06-24 ENCOUNTER — Other Ambulatory Visit: Payer: Self-pay | Admitting: Obstetrics & Gynecology

## 2015-06-25 LAB — CYTOLOGY - PAP

## 2018-08-01 LAB — PROTIME-INR: INR: 3.2 — AB (ref 0.9–1.1)

## 2018-08-14 ENCOUNTER — Telehealth: Payer: Self-pay | Admitting: *Deleted

## 2018-08-14 NOTE — Telephone Encounter (Signed)
Left message to return call regarding INR results and further coumadin dosing instructions.

## 2018-08-16 LAB — PROTIME-INR: INR: 3.4 — AB (ref 0.9–1.1)

## 2018-08-19 NOTE — Telephone Encounter (Signed)
Left message to return call 

## 2018-08-30 ENCOUNTER — Telehealth: Payer: Self-pay | Admitting: *Deleted

## 2018-08-30 LAB — POCT INR: INR: 3.2 — AB (ref 2.0–3.0)

## 2018-08-30 NOTE — Telephone Encounter (Signed)
Received an INR from 08/16/18. LMOM  For pt to call as this INR is 512 weeks old and she need to do one today for appropriate dosing.

## 2018-09-04 ENCOUNTER — Ambulatory Visit (INDEPENDENT_AMBULATORY_CARE_PROVIDER_SITE_OTHER): Payer: Self-pay | Admitting: Internal Medicine

## 2018-09-04 DIAGNOSIS — I4891 Unspecified atrial fibrillation: Secondary | ICD-10-CM

## 2018-09-04 DIAGNOSIS — Z952 Presence of prosthetic heart valve: Secondary | ICD-10-CM

## 2018-09-04 DIAGNOSIS — Z5181 Encounter for therapeutic drug level monitoring: Secondary | ICD-10-CM

## 2018-09-04 NOTE — Telephone Encounter (Signed)
See anticoag encounter from 09/04/18 for details.

## 2018-09-05 ENCOUNTER — Telehealth: Payer: Self-pay

## 2018-09-05 ENCOUNTER — Ambulatory Visit (INDEPENDENT_AMBULATORY_CARE_PROVIDER_SITE_OTHER): Payer: Self-pay

## 2018-09-05 DIAGNOSIS — I4891 Unspecified atrial fibrillation: Secondary | ICD-10-CM

## 2018-09-05 NOTE — Telephone Encounter (Signed)
Spoke with pt and reminded pt of remote transmission that is due today. Pt verbalized understanding.   

## 2018-09-06 ENCOUNTER — Encounter: Payer: Self-pay | Admitting: Cardiology

## 2018-09-06 NOTE — Progress Notes (Signed)
Remote pacemaker transmission.   

## 2018-09-14 LAB — PROTIME-INR

## 2018-09-16 ENCOUNTER — Ambulatory Visit: Payer: PRIVATE HEALTH INSURANCE | Admitting: Cardiology

## 2018-09-18 ENCOUNTER — Telehealth: Payer: Self-pay | Admitting: Pharmacist

## 2018-09-18 NOTE — Telephone Encounter (Signed)
Received fax from [place] at Dr. York Spanielilley's office of INR from 09/14/18. Of note INR was 5.0.   We took over management of warfarin while Dr. Donnie Ahoilley on leave as pt had appt to establish with Dr. Tomie Chinaevankar on 09/16/18. The patient no showed this appt and has been noncompliant with home monitoring of INR - she was asked to check on 09/13/18.   Pamala Hurryandance Hemphill, RN spoke with patient on 09/16/18 to remind her to check - pt at that time informed her that machine was giving error and made no mention of INR from 09/14/18 being 5.0.   Since patient did not keep appt with our physician and has not rescheduled we are unable to continue to manage anticoagulation. I have sent information back Gearldine BienenstockBrandy that we are unable to manage due to above.

## 2018-10-10 ENCOUNTER — Telehealth: Payer: Self-pay | Admitting: Cardiology

## 2018-10-10 NOTE — Telephone Encounter (Signed)
Received a request in [employer] [facility] to [facility]. Confirmed w/ pt that this is ok.

## 2018-11-01 LAB — CUP PACEART REMOTE DEVICE CHECK
Battery Remaining Longevity: 107 mo
Brady Statistic AP VP Percent: 10 %
Brady Statistic AP VS Percent: 0 %
Brady Statistic AS VP Percent: 90 %
Brady Statistic AS VS Percent: 0 %
Implantable Lead Implant Date: 20121119
Implantable Lead Implant Date: 20121119
Implantable Lead Location: 753859
Implantable Lead Location: 753860
Implantable Pulse Generator Implant Date: 20121119
Lead Channel Impedance Value: 539 Ohm
Lead Channel Impedance Value: 541 Ohm
Lead Channel Pacing Threshold Amplitude: 0.375 V
Lead Channel Pacing Threshold Amplitude: 0.75 V
Lead Channel Setting Pacing Amplitude: 1.5 V
Lead Channel Setting Pacing Amplitude: 2 V
MDC IDC MSMT BATTERY IMPEDANCE: 371 Ohm
MDC IDC MSMT BATTERY VOLTAGE: 2.8 V
MDC IDC MSMT LEADCHNL RA PACING THRESHOLD PULSEWIDTH: 0.4 ms
MDC IDC MSMT LEADCHNL RV PACING THRESHOLD PULSEWIDTH: 0.4 ms
MDC IDC SESS DTM: 20191122014911
MDC IDC SET LEADCHNL RV PACING PULSEWIDTH: 0.4 ms
MDC IDC SET LEADCHNL RV SENSING SENSITIVITY: 2 mV

## 2018-11-06 ENCOUNTER — Telehealth: Payer: Self-pay | Admitting: Cardiology

## 2018-11-06 NOTE — Telephone Encounter (Signed)
° °  1. Which medications need to be refilled? (please list name of each medication and dose if known) Metoprolol tartrate 25mg  BID; Warfarin SOD 5mg  tablets, 1 tablet six days a week, then 1-1/2 tablet one day a week  2. Which pharmacy/location (including street and city if local pharmacy) is medication to be sent to?[facility]  3. Do they need a 30 day or 90 day supply? 90

## 2018-11-07 ENCOUNTER — Other Ambulatory Visit: Payer: Self-pay

## 2018-11-07 NOTE — Telephone Encounter (Signed)
Called patient as she needs a appointment for refill per Dr. Tomie China. Patient reports she has recently moved to [facility] and has a new doctor who is managing this.

## 2018-11-07 NOTE — Telephone Encounter (Signed)
Has been given to Dr. Tomie China for him to review. Will send coumadin request to coumadin clinic

## 2019-09-01 ENCOUNTER — Other Ambulatory Visit: Payer: Self-pay | Admitting: Cardiology

## 2019-09-01 DIAGNOSIS — Z5181 Encounter for therapeutic drug level monitoring: Secondary | ICD-10-CM

## 2019-09-01 DIAGNOSIS — I4891 Unspecified atrial fibrillation: Secondary | ICD-10-CM

## 2019-09-01 DIAGNOSIS — Z952 Presence of prosthetic heart valve: Secondary | ICD-10-CM

## 2019-09-02 ENCOUNTER — Other Ambulatory Visit: Payer: Self-pay | Admitting: Cardiology

## 2019-09-02 DIAGNOSIS — Z5181 Encounter for therapeutic drug level monitoring: Secondary | ICD-10-CM

## 2019-09-02 DIAGNOSIS — I4891 Unspecified atrial fibrillation: Secondary | ICD-10-CM

## 2019-09-02 DIAGNOSIS — Z952 Presence of prosthetic heart valve: Secondary | ICD-10-CM
